# Patient Record
Sex: Male | Born: 1950 | Race: White | Hispanic: No | Smoking: Never smoker
Health system: Southern US, Community
[De-identification: ages and names within clinical notes are randomized; demographics above are authoritative.]

## PROBLEM LIST (undated history)

## (undated) DIAGNOSIS — M1990 Unspecified osteoarthritis, unspecified site: Secondary | ICD-10-CM

## (undated) DIAGNOSIS — K219 Gastro-esophageal reflux disease without esophagitis: Secondary | ICD-10-CM

## (undated) DIAGNOSIS — C61 Malignant neoplasm of prostate: Secondary | ICD-10-CM

## (undated) DIAGNOSIS — R3915 Urgency of urination: Secondary | ICD-10-CM

## (undated) DIAGNOSIS — I10 Essential (primary) hypertension: Secondary | ICD-10-CM

## (undated) DIAGNOSIS — Z973 Presence of spectacles and contact lenses: Secondary | ICD-10-CM

## (undated) DIAGNOSIS — K5730 Diverticulosis of large intestine without perforation or abscess without bleeding: Secondary | ICD-10-CM

## (undated) DIAGNOSIS — R350 Frequency of micturition: Secondary | ICD-10-CM

## (undated) DIAGNOSIS — N400 Enlarged prostate without lower urinary tract symptoms: Secondary | ICD-10-CM

## (undated) DIAGNOSIS — K649 Unspecified hemorrhoids: Secondary | ICD-10-CM

## (undated) DIAGNOSIS — Z923 Personal history of irradiation: Secondary | ICD-10-CM

## (undated) DIAGNOSIS — Z87442 Personal history of urinary calculi: Secondary | ICD-10-CM

## (undated) DIAGNOSIS — N201 Calculus of ureter: Secondary | ICD-10-CM

## (undated) DIAGNOSIS — N200 Calculus of kidney: Secondary | ICD-10-CM

## (undated) DIAGNOSIS — Z8709 Personal history of other diseases of the respiratory system: Secondary | ICD-10-CM

## (undated) HISTORY — PX: EXTRACORPOREAL SHOCK WAVE LITHOTRIPSY: SHX1557

---

## 2006-12-24 ENCOUNTER — Ambulatory Visit (HOSPITAL_COMMUNITY): Admission: RE | Admit: 2006-12-24 | Discharge: 2006-12-24 | Payer: Self-pay | Admitting: Urology

## 2011-07-14 ENCOUNTER — Other Ambulatory Visit: Payer: Self-pay | Admitting: Urology

## 2011-07-15 ENCOUNTER — Encounter (HOSPITAL_COMMUNITY): Payer: Self-pay | Admitting: Pharmacy Technician

## 2011-07-16 ENCOUNTER — Encounter (HOSPITAL_COMMUNITY): Payer: Self-pay | Admitting: *Deleted

## 2011-07-16 NOTE — Pre-Procedure Instructions (Signed)
Patient instructed to bring blue folder from [facility] with forms filed out, to be npo after midnight Sunday then may have clear liquids until 1100 am,. Patient informed not to take aspirin, ibuprofen or toradol 72 hours prior to the litho, also to stop all vitamins and supplements prior til litho. Patient to arrive in short stay with driver, insurance card, and picture ID at 1500 on Jul 21, 2011.

## 2011-07-21 ENCOUNTER — Ambulatory Visit (HOSPITAL_COMMUNITY)
Admission: RE | Admit: 2011-07-21 | Discharge: 2011-07-21 | Disposition: A | Discharge status: Home / Self Care | Payer: BC Managed Care – PPO | Source: Ambulatory Visit | Attending: Urology | Admitting: Urology

## 2011-07-21 ENCOUNTER — Ambulatory Visit (HOSPITAL_COMMUNITY): Payer: BC Managed Care – PPO

## 2011-07-21 ENCOUNTER — Encounter (HOSPITAL_COMMUNITY)
Admission: RE | Disposition: A | Discharge status: Home / Self Care | Payer: Self-pay | Source: Ambulatory Visit | Attending: Urology

## 2011-07-21 DIAGNOSIS — Z01818 Encounter for other preprocedural examination: Secondary | ICD-10-CM | POA: Insufficient documentation

## 2011-07-21 DIAGNOSIS — I10 Essential (primary) hypertension: Secondary | ICD-10-CM | POA: Insufficient documentation

## 2011-07-21 DIAGNOSIS — N200 Calculus of kidney: Secondary | ICD-10-CM | POA: Diagnosis present

## 2011-07-21 DIAGNOSIS — N201 Calculus of ureter: Secondary | ICD-10-CM | POA: Insufficient documentation

## 2011-07-21 DIAGNOSIS — Z79899 Other long term (current) drug therapy: Secondary | ICD-10-CM | POA: Insufficient documentation

## 2011-07-21 HISTORY — DX: Essential (primary) hypertension: I10

## 2011-07-21 SURGERY — LITHOTRIPSY, ESWL
Anesthesia: LOCAL | Laterality: Left

## 2011-07-21 MED ORDER — DEXTROSE-NACL 5-0.45 % IV SOLN
Freq: Once | INTRAVENOUS | Status: AC
  Administered 2011-07-21: 16:00:00 via INTRAVENOUS

## 2011-07-21 MED ORDER — CIPROFLOXACIN HCL 500 MG PO TABS
ORAL_TABLET | ORAL | Status: AC
  Administered 2011-07-21: 500 mg via ORAL
  Filled 2011-07-21: qty 1

## 2011-07-21 MED ORDER — DIPHENHYDRAMINE HCL 25 MG PO CAPS
25.0000 mg | ORAL_CAPSULE | ORAL | Status: AC
  Administered 2011-07-21: 25 mg via ORAL

## 2011-07-21 MED ORDER — CIPROFLOXACIN HCL 500 MG PO TABS
500.0000 mg | ORAL_TABLET | Freq: Once | ORAL | Status: AC
  Administered 2011-07-21: 500 mg via ORAL

## 2011-07-21 MED ORDER — DIAZEPAM 5 MG PO TABS
10.0000 mg | ORAL_TABLET | ORAL | Status: AC
  Administered 2011-07-21: 10 mg via ORAL

## 2011-07-21 MED ORDER — DIPHENHYDRAMINE HCL 25 MG PO CAPS
ORAL_CAPSULE | ORAL | Status: AC
  Administered 2011-07-21: 25 mg via ORAL
  Filled 2011-07-21: qty 1

## 2011-07-21 MED ORDER — DIAZEPAM 5 MG PO TABS
ORAL_TABLET | ORAL | Status: AC
  Administered 2011-07-21: 10 mg via ORAL
  Filled 2011-07-21: qty 2

## 2011-07-21 NOTE — Interval H&P Note (Signed)
History and Physical Interval Note:   07/21/2011   5:23 PM   Jeffrey Goodman  has presented today for surgery, with the diagnosis of left ureteral stone  The various methods of treatment have been discussed with the patient and family. After consideration of risks, benefits and other options for treatment, the patient has consented to  Procedure(s): EXTRACORPOREAL SHOCK WAVE LITHOTRIPSY (ESWL) as a surgical intervention .  The patients' history has been reviewed, patient examined, no change in status, stable for surgery.  I have reviewed the patients' chart and labs.  Questions were answered to the patient's satisfaction.     Anner Crete  MD

## 2011-07-21 NOTE — Progress Notes (Signed)
Pt. Came back from lithotripsy truck at 18:18pm via wheelchair. Pt. Was A&O x 3. Left flank was pinkish in color. The skin was not broken or oozing. Pt. Denied pain. IV patent. Vital signs stable. Lenn Sink, Charity fundraiser.

## 2011-07-21 NOTE — Brief Op Note (Signed)
07/21/2011  5:29 PM  PATIENT:  Jeffrey Goodman  60 y.o. male  PRE-OPERATIVE DIAGNOSIS:  left renalstone  POST-OPERATIVE DIAGNOSIS:  Left renal stone  PROCEDURE:  Procedure(s): EXTRACORPOREAL SHOCK WAVE LITHOTRIPSY (ESWL)  SURGEON:  Surgeon(s): Anner Crete  PHYSICIAN ASSISTANT:   ASSISTANTS: none   ANESTHESIA:   IV sedation  EBL:     BLOOD ADMINISTERED:none  DRAINS: none   LOCAL MEDICATIONS USED:  NONE  SPECIMEN:  No Specimen  DISPOSITION OF SPECIMEN:  N/A  COUNTS:  YES  TOURNIQUET:  * No tourniquets in log *  DICTATION: .Note written in paper chart  PLAN OF CARE: Discharge to home after PACU  PATIENT DISPOSITION:  PACU - hemodynamically stable.   Delay start of Pharmacological VTE agent (>24hrs) due to surgical blood loss or risk of bleeding:  {YES/NO/NOT APPLICABLE:20182

## 2011-07-21 NOTE — H&P (Signed)
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 Ureteral Stone Left 592.1  History of Present Illness  Jeffrey Goodman is a former patient of Dr. Aldean Ast with a history of stones. He is to have left ESWL today.  He had the onset last Tuesday of left flank pain.  The pain was initially moderate but became severe over the weekend.  He had seen his PCP and got hydrocodone and tamsulosin on Friday.  He has had no nausea.  He had a UA that showed trace blood.  He has no gross hematuria or urinary symptoms.  He has had two prior calcium oxalate stones.  He had ESWL in 4/08 on the right.   Past Medical History Problems  1. History of  Arthritis V13.4 2. History of  Hypertension 401.9 3. History of  Nephrolithiasis Of The Left Kidney V13.01 4. History of  Nephrolithiasis Of The Right Kidney V13.01  Surgical History Problems  1. History of  Lithotripsy  Current Meds 1. Benadryl TABS; Therapy: (Recorded:29Apr2008) to 2. Diltia XT 240 MG CP24; Therapy: (Recorded:10Apr2008) to 3. GuanFACINE HCl 2 MG Oral Tablet; Therapy: 10Jan2012 to 4. Hydrochlorothiazide 25 MG Oral Tablet; Therapy: (Recorded:10Apr2008) to 5. Hydrocodone-Acetaminophen 5-325 MG Oral Tablet; Therapy: 02Nov2012 to 6. Multi-Vitamin TABS; Therapy: (Recorded:29Apr2008) to 7. Tamsulosin HCl 0.4 MG Oral Capsule; Therapy: 02Nov2012 to  Allergies Medication  1. No Known Drug Allergies  Family History Problems  1. Paternal history of  Diabetes Mellitus V18.0 2. Family history of  Family Health Status Number Of Children 2 sons  +   1 son deceased 3. Paternal history of  Hypertension V17.49 4. Maternal history of  Hypertension V17.49 5. Family history of  Nephrolithiasis 6. 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 Denied    History of   Tobacco Use V15.82 occasional shewing tobacco  Review of Systems Genitourinary, constitutional, skin, eye, otolaryngeal, hematologic/lymphatic, cardiovascular, pulmonary, endocrine, musculoskeletal, gastrointestinal, neurological and psychiatric system(s) were reviewed and pertinent findings if present are noted.  Genitourinary: nocturia.  Gastrointestinal: abdominal pain and heartburn.  ENT: sinus problems.  Musculoskeletal: back pain and joint pain.    Vitals Vital Signs [Data Includes: Last 1 Day]  05Nov2012 10:39AM  BMI Calculated: 32.24 BSA Calculated: 2.29 Height: 6 ft  Weight: 238 lb  Blood Pressure: 160 / 108 Temperature: 99.1 F Heart Rate: 88  Physical Exam Constitutional: Well nourished and well developed . No acute distress.  ENT:. The ears and nose are normal in appearance.  Neck: The appearance of the neck is normal and no neck mass is present.  Pulmonary: No respiratory distress and normal respiratory rhythm and effort.  Cardiovascular: Heart rate and rhythm are normal . No peripheral edema.  Abdomen: The abdomen is soft and nontender. No masses are palpated. No CVA tenderness. No hernias are palpable. No hepatosplenomegaly noted.  Skin: Normal skin turgor, no visible rash and no visible skin lesions.  Neuro/Psych:. Mood and affect are appropriate.    Results/Data Urine [Data Includes: Last 1 Day]  05Nov2012  COLOR: YELLOW  Reference Range YELLOW APPEARANCE: CLEAR  Reference Range CLEAR SPECIFIC GRAVITY: 1.025  Reference Range 1.005-1.030 pH: 6.0  Reference Range 5.0-8.0 GLUCOSE: NEG mg/dL Reference Range NEG BILIRUBIN: NEG  Reference Range NEG KETONE: TRACE mg/dL Abnormal Reference Range NEG BLOOD: TRACE  Abnormal Reference Range NEG PROTEIN: NEG mg/dL Reference Range NEG UROBILINOGEN: 0.2 mg/dL Reference Range 1.6-1.0 NITRITE: NEG  Reference Range NEG LEUKOCYTE ESTERASE: NEG  Reference Range NEG SQUAMOUS EPITHELIAL/HPF: NONE SEEN  Reference Range  RARE WBC: 4-6 WBC/hpf Abnormal Reference Range <4 RBC: 0-3 RBC/hpf Reference Range <4 BACTERIA: NONE SEEN  Reference Range RARE CRYSTALS: NONE SEEN  Reference Range NEG CASTS: Hyaline casts noted  Reference Range NEG  Old records or history reviewed:Marland Kitchen  The following images/tracing/specimen were independently visualized:  KUB today shows a 17x20mm LUPJ stone and smaller RLP [employer] stones. No other significant abnormalities are noted.    Assessment Assessed  1. Ureteral Stone Left 592.1   He has a 17mm LUPJ stone with symptoms.   Plan Health Maintenance (V70.0)  1. UA With REFLEX  Done: 05Nov2012 10:18AM Ureteral Stone (592.1)  2. KUB  Done: 05Nov2012 12:00AM 3. PTH INTACT with CALCIUM  Requested for: 05Nov2012 4. Follow-up Schedule Surgery Office  Follow-up  Requested for: 05Nov2012   He needs ESWL and the risks including but not limited to bleeding, infection, renal and organ injuries, need for secondary procedures, thrombotic events and sedation risks.  He has a 90% chance of fragmentation and about a 75% chance of not needing additional procedures.   Discussion/Summary  CC: Dr Marjory Lies.

## 2011-07-22 ENCOUNTER — Encounter (HOSPITAL_COMMUNITY): Payer: Self-pay

## 2012-10-11 ENCOUNTER — Other Ambulatory Visit: Payer: Self-pay | Admitting: Orthopedic Surgery

## 2012-10-11 MED ORDER — DEXAMETHASONE SODIUM PHOSPHATE 10 MG/ML IJ SOLN: 10.0000 mg | Freq: Once | INTRAMUSCULAR | Status: DC

## 2012-10-11 MED ORDER — BUPIVACAINE LIPOSOME 1.3 % IJ SUSP: 20.0000 mL | Freq: Once | INTRAMUSCULAR | Status: DC

## 2012-10-11 NOTE — Progress Notes (Signed)
Preoperative surgical orders have been place into the Epic hospital system for Jeffrey Goodman on 10/11/2012, 7:41 AM  by Patrica Duel for surgery on 11/08/2012.  Preop Total Knee orders including Experal, IV Tylenol, and IV Decadron as long as there are no contraindications to the above medications. Avel Peace, PA-C

## 2012-10-29 ENCOUNTER — Encounter (HOSPITAL_COMMUNITY): Payer: Self-pay | Admitting: Pharmacy Technician

## 2012-11-02 ENCOUNTER — Ambulatory Visit (HOSPITAL_COMMUNITY)
Admission: RE | Admit: 2012-11-02 | Discharge: 2012-11-02 | Disposition: A | Discharge status: Home / Self Care | Payer: BC Managed Care – PPO | Source: Ambulatory Visit | Attending: Orthopedic Surgery | Admitting: Orthopedic Surgery

## 2012-11-02 ENCOUNTER — Encounter (HOSPITAL_COMMUNITY)
Admission: RE | Admit: 2012-11-02 | Discharge: 2012-11-02 | Disposition: A | Discharge status: Home / Self Care | Payer: BC Managed Care – PPO | Source: Ambulatory Visit | Attending: Orthopedic Surgery | Admitting: Orthopedic Surgery

## 2012-11-02 ENCOUNTER — Other Ambulatory Visit: Payer: Self-pay | Admitting: Orthopedic Surgery

## 2012-11-02 ENCOUNTER — Encounter (HOSPITAL_COMMUNITY): Payer: Self-pay

## 2012-11-02 DIAGNOSIS — I10 Essential (primary) hypertension: Secondary | ICD-10-CM | POA: Insufficient documentation

## 2012-11-02 DIAGNOSIS — Z01812 Encounter for preprocedural laboratory examination: Secondary | ICD-10-CM | POA: Insufficient documentation

## 2012-11-02 DIAGNOSIS — Z01818 Encounter for other preprocedural examination: Secondary | ICD-10-CM | POA: Insufficient documentation

## 2012-11-02 HISTORY — DX: Gastro-esophageal reflux disease without esophagitis: K21.9

## 2012-11-02 LAB — URINALYSIS, ROUTINE W REFLEX MICROSCOPIC
Nitrite: NEGATIVE
Protein, ur: NEGATIVE mg/dL
Urobilinogen, UA: 1 mg/dL (ref 0.0–1.0)

## 2012-11-02 LAB — CBC
HCT: 41.9 % (ref 39.0–52.0)
MCHC: 35.3 g/dL (ref 30.0–36.0)
MCV: 92.5 fL (ref 78.0–100.0)
Platelets: 225 10*3/uL (ref 150–400)
RDW: 12.5 % (ref 11.5–15.5)

## 2012-11-02 LAB — PROTIME-INR
INR: 1.07 (ref 0.00–1.49)
Prothrombin Time: 13.8 seconds (ref 11.6–15.2)

## 2012-11-02 LAB — COMPREHENSIVE METABOLIC PANEL
AST: 43 U/L — ABNORMAL HIGH (ref 0–37)
Albumin: 4 g/dL (ref 3.5–5.2)
BUN: 24 mg/dL — ABNORMAL HIGH (ref 6–23)
Creatinine, Ser: 1.11 mg/dL (ref 0.50–1.35)
Total Protein: 6.9 g/dL (ref 6.0–8.3)

## 2012-11-02 LAB — SURGICAL PCR SCREEN
MRSA, PCR: NEGATIVE
Staphylococcus aureus: POSITIVE — AB

## 2012-11-02 LAB — APTT: aPTT: 35 seconds (ref 24–37)

## 2012-11-02 NOTE — Patient Instructions (Signed)
YOUR SURGERY IS SCHEDULED AT [facility]  ON:  Monday  3/3  REPORT TO [place] [facility] AT:  6:00 AM      PHONE # FOR SHORT STAY IS [phone]  DO NOT EAT OR DRINK ANYTHING AFTER MIDNIGHT THE NIGHT BEFORE YOUR SURGERY.  YOU MAY BRUSH YOUR TEETH, RINSE OUT YOUR MOUTH--BUT NO WATER, NO FOOD, NO CHEWING GUM, NO MINTS, NO CANDIES, NO CHEWING TOBACCO.  PLEASE TAKE THE FOLLOWING MEDICATIONS THE AM OF YOUR SURGERY WITH A FEW SIPS OF WATER:  DILTIAZEM AND PRILOSEC.  MAY USE AFRIN NASAL SPRAY-AND BRING SPRAY TO HOSPITAL.   DO NOT BRING VALUABLES, MONEY, CREDIT CARDS.  DO NOT WEAR JEWELRY, MAKE-UP, NAIL POLISH AND NO METAL PINS OR CLIPS IN YOUR HAIR. CONTACT LENS, DENTURES / PARTIALS, GLASSES SHOULD NOT BE WORN TO SURGERY AND IN MOST CASES-HEARING AIDS WILL NEED TO BE REMOVED.  BRING YOUR GLASSES CASE, ANY EQUIPMENT NEEDED FOR YOUR CONTACT LENS. FOR PATIENTS ADMITTED TO THE HOSPITAL--CHECK OUT TIME THE DAY OF DISCHARGE IS 11:00 AM.  ALL INPATIENT ROOMS ARE PRIVATE - WITH BATHROOM, TELEPHONE, TELEVISION AND WIFI INTERNET.                             PLEASE READ OVER ANY  FACT SHEETS THAT YOU WERE GIVEN: MRSA INFORMATION, BLOOD TRANSFUSION INFORMATION, INCENTIVE SPIROMETER INFORMATION. FAILURE TO FOLLOW THESE INSTRUCTIONS MAY RESULT IN THE CANCELLATION OF YOUR SURGERY.   PATIENT SIGNATURE_________________________________

## 2012-11-02 NOTE — Pre-Procedure Instructions (Signed)
PREOP CBC, CMET, PT, PTT, UA, T/S, EKG AND CXR WERE DONE AS PER ORDERS DR. Lequita Halt AND ANESTHESIOLOGIST'S GUIDELINES.

## 2012-11-02 NOTE — H&P (Signed)
Jeffrey Goodman  DOB: 1950/11/21 Single / Language: [place] / Race: White Male  Date of Admission:  11/08/2012  Chief Complaint:  Left Knee Pain  History of Present Illness The patient is a 62 year old male who comes in for a preoperative History and Physical. The patient is scheduled for a left total knee arthroplasty to be performed by Dr. Gus Rankin. Aluisio, MD at [facility] . Please see the hospital record for complete dictated history and physical. The patient is a 62 year old male who presents with knee complaints. The patient was seen for a second opinion. The patient reports left knee (worse than right) symptoms including: pain, swelling, stiffness and soreness . The patient describes the severity of the symptoms as moderate in severity.The patient feels that the symptoms are worsening. The patient has the current diagnosis of knee osteoarthritis. Prior to being seen today the patient was previously evaluated by a colleague (He has been recently seeing Dr. Madelon Lips at [facility]. He previously was seen here and completed a series of Synvisc in the left knee in 2009.). Previous work-up for this problem has included knee x-rays. Past treatment for this problem has included intra-articular injection of corticosteroids (as well as 5 series of visco in the left knee, and 2 or 3 series in the right. The visco injections have become less effective each time). Symptoms are reported to be located in the left knee (worse than right) and include lateral knee pain, decreased range of motion and difficulty bearing weight. Symptoms are relieved by rest and ice. Current treatment includes application of ice, nonsteroidal anti-inflammatory drugs (Aleve) and non-opioid analgesics (acetaminophen). Note for "Knee pain": He states he has been told he needs to have a total knee. He comes in back today, because his mother had a knee replacement here and she has been doing very well. Nida Boatman has had  several series of visco supplements with Dr. Madelon Lips. Unfortunately, each series has subsequently had less effect. I operated on his mother about ten years ago doing an arthroplasty procedure. He wanted to come to me to be evaluated and possibly have his knee fixed. The knee is hurting him at all times. This is limiting what he can and can not do. He still continues to work but it is getting more difficult to do his occupation responsibilities. He does have pain at night. He does have functional limitations. He is not having hip pain, back pain or lower extremity weakness or paresthesias. He is not having any right knee pain at this time. He is ready to get the left knee fixed. They have been treated conservatively in the past for the above stated problem and despite conservative measures, they continue to have progressive pain and severe functional limitations and dysfunction. They have failed non-operative management including home exercise, medications, and injections. It is felt that they would benefit from undergoing total joint replacement. Risks and benefits of the procedure have been discussed with the patient and they elect to proceed with surgery. There are no active contraindications to surgery such as ongoing infection or rapidly progressive neurological disease.    Problem List Primary osteoarthritis of both knees (715.16)   Allergies No Known Drug Allergies   Family History Cerebrovascular Accident. mother Diabetes Mellitus. father Heart Disease. father Bleeding disorder. father Cancer. grandmother mothers side Hypertension. mother, father and brother Osteoarthritis. mother and father Osteoporosis. mother Father. Deceased, Dementia. age 19   Social History Drug/Alcohol Rehab (Previously). no Exercise. Exercises weekly; does  gym / weights Illicit drug use. no Children. 2 Current work status. working full time Drug/Alcohol Rehab (Currently). no Tobacco  / smoke exposure. yes outdoors only Tobacco use. never smoker Marital status. divorced Number of flights of stairs before winded. less than 1 Pain Contract. no Alcohol use. current drinker; drinks wine; 5-7 per week Post-Surgical Plans. Plan is to go home. Current occupation. Senior Equities trader   Medication History Diltiazem HCl ER Beads (240MG  Capsule ER 24HR, Oral two times daily) Active. GuanFACINE HCl (2MG  Tablet, Oral) Active. Hydrochlorothiazide (25MG  Tablet, Oral) Active.   Past Surgical History Lithotripsy. Twice   Medical History High blood pressure Kidney Stone Hemorrhoids. External; Occassional flare   Review of Systems General:Not Present- Chills, Fever, Night Sweats, Fatigue, Weight Gain, Weight Loss and Memory Loss. Skin:Not Present- Hives, Itching, Rash, Eczema and Lesions. HEENT:Not Present- Tinnitus, Headache, Double Vision, Visual Loss, Hearing Loss and Dentures. Respiratory:Not Present- Shortness of breath with exertion, Shortness of breath at rest, Allergies, Coughing up blood and Chronic Cough. Cardiovascular:Not Present- Chest Pain, Racing/skipping heartbeats, Difficulty Breathing Lying Down, Murmur, Swelling and Palpitations. Gastrointestinal:Not Present- Bloody Stool, Heartburn, Abdominal Pain, Vomiting, Nausea, Constipation, Diarrhea, Difficulty Swallowing, Jaundice and Loss of appetitie. Male Genitourinary:Not Present- Urinary frequency, Blood in Urine, Weak urinary stream, Discharge, Flank Pain, Incontinence, Painful Urination, Urgency, Urinary Retention and Urinating at Night. Musculoskeletal:Present- Joint Swelling and Joint Pain. Not Present- Muscle Weakness, Muscle Pain, Back Pain, Morning Stiffness and Spasms. Neurological:Not Present- Tremor, Dizziness, Blackout spells, Paralysis, Difficulty with balance and Weakness. Psychiatric:Not Present- Insomnia.   Vitals Weight: 230 lb Height: 71 in Weight was  reported by patient. Height was reported by patient. Body Surface Area: 2.29 m Body Mass Index: 32.08 kg/m Pulse: 76 (Regular) Resp.: 14 (Unlabored) BP: 136/84 (Sitting, Right Arm, Standard)    Physical Exam The physical exam findings are as follows:  Note: Patient is a 62 year old male with continued knee pain.   General Mental Status - Alert, cooperative and good historian. General Appearance- pleasant. Not in acute distress. Orientation- Oriented X3. Build & Nutrition- Well nourished and Well developed.   Head and Neck Head- normocephalic, atraumatic . Neck Global Assessment- supple. no bruit auscultated on the right and no bruit auscultated on the left.   Eye Vision- Wears corrective lenses. Pupil- Bilateral- Regular and Round. Motion- Bilateral- EOMI.   Chest and Lung Exam Auscultation: Breath sounds:- clear at anterior chest wall and - clear at posterior chest wall. Adventitious sounds:- No Adventitious sounds.   Cardiovascular Auscultation:Rhythm- Regular rate and rhythm. Heart Sounds- S1 WNL and S2 WNL. Murmurs & Other Heart Sounds:Auscultation of the heart reveals - No Murmurs.   Abdomen Palpation/Percussion:Tenderness- Abdomen is non-tender to palpation. Rigidity (guarding)- Abdomen is soft. Auscultation:Auscultation of the abdomen reveals - Bowel sounds normal.   Male Genitourinary Not done, not pertinent to present illness  Musculoskeletal He is a well developed male. He is alert and oriented. No apparent distress. Both hips show a normal range of motion. No discomfort. The right knee shows no effusion. Range is 5-135. There is no medial or lateral joint line tenderness or instability noted. The left knee shows no effusion. Varus deformity. Range is 5-140. There is marked crepitus on range of motion. There is tenderness medial greater than lateral with no instability noted. Pulse, sensation and motor are intact.  Gait pattern is non-antalgic.  RADIOGRAPHS: AP of both knees and lateral show that he has bone on bone arthritis in the medial and patellofemoral compartments of the left  knee with varus deformity.  Assessment & Plan Primary osteoarthritis of both knees (715.16)  Note: Plan is for a Left Total Knee Replacement by Dr. Lequita Halt.  Plan is to go home.  PCP - Dr. Fuller Mandril [facility] - Dr. Annabell Howells  Signed electronically by Roberts Gaudy, PA-C

## 2012-11-07 NOTE — Anesthesia Preprocedure Evaluation (Addendum)
Anesthesia Evaluation  Patient identified by MRN, date of birth, ID band Patient awake    Reviewed: Allergy & Precautions, H&P , NPO status , Patient's Chart, lab work & pertinent test results, reviewed documented beta blocker date and time   Airway Mallampati: II TM Distance: >3 FB Neck ROM: full    Dental no notable dental hx. (+) Teeth Intact and Dental Advisory Given   Pulmonary neg pulmonary ROS,  breath sounds clear to auscultation  Pulmonary exam normal       Cardiovascular hypertension, Pt. on medications Rhythm:regular Rate:Normal     Neuro/Psych negative neurological ROS  negative psych ROS   GI/Hepatic negative GI ROS, Neg liver ROS, GERD-  Medicated and Controlled,  Endo/Other  negative endocrine ROS  Renal/GU negative Renal ROS  negative genitourinary   Musculoskeletal   Abdominal   Peds  Hematology negative hematology ROS (+)   Anesthesia Other Findings   Reproductive/Obstetrics negative OB ROS                          Anesthesia Physical Anesthesia Plan  ASA: II  Anesthesia Plan: Spinal   Post-op Pain Management:    Induction:   Airway Management Planned: Simple Face Mask  Additional Equipment:   Intra-op Plan:   Post-operative Plan:   Informed Consent: I have reviewed the patients History and Physical, chart, labs and discussed the procedure including the risks, benefits and alternatives for the proposed anesthesia with the patient or authorized representative who has indicated his/her understanding and acceptance.   Dental Advisory Given  Plan Discussed with: CRNA and Surgeon  Anesthesia Plan Comments:         Anesthesia Quick Evaluation

## 2012-11-08 ENCOUNTER — Encounter (HOSPITAL_COMMUNITY): Payer: Self-pay | Admitting: *Deleted

## 2012-11-08 ENCOUNTER — Inpatient Hospital Stay (HOSPITAL_COMMUNITY)
Admission: RE | Admit: 2012-11-08 | Discharge: 2012-11-10 | DRG: 209 | Disposition: A | Discharge status: Home Health | Payer: BC Managed Care – PPO | Source: Ambulatory Visit | Attending: Orthopedic Surgery | Admitting: Orthopedic Surgery

## 2012-11-08 ENCOUNTER — Inpatient Hospital Stay (HOSPITAL_COMMUNITY): Payer: BC Managed Care – PPO | Admitting: Anesthesiology

## 2012-11-08 ENCOUNTER — Encounter (HOSPITAL_COMMUNITY): Payer: Self-pay | Admitting: Anesthesiology

## 2012-11-08 ENCOUNTER — Encounter (HOSPITAL_COMMUNITY)
Admission: RE | Disposition: A | Discharge status: Home Health | Payer: Self-pay | Source: Ambulatory Visit | Attending: Orthopedic Surgery

## 2012-11-08 DIAGNOSIS — Z87442 Personal history of urinary calculi: Secondary | ICD-10-CM

## 2012-11-08 DIAGNOSIS — M179 Osteoarthritis of knee, unspecified: Secondary | ICD-10-CM | POA: Diagnosis present

## 2012-11-08 DIAGNOSIS — Z96652 Presence of left artificial knee joint: Secondary | ICD-10-CM

## 2012-11-08 DIAGNOSIS — J329 Chronic sinusitis, unspecified: Secondary | ICD-10-CM | POA: Diagnosis present

## 2012-11-08 DIAGNOSIS — M1710 Unilateral primary osteoarthritis, unspecified knee: Principal | ICD-10-CM | POA: Diagnosis present

## 2012-11-08 DIAGNOSIS — I10 Essential (primary) hypertension: Secondary | ICD-10-CM | POA: Diagnosis present

## 2012-11-08 DIAGNOSIS — K219 Gastro-esophageal reflux disease without esophagitis: Secondary | ICD-10-CM | POA: Diagnosis present

## 2012-11-08 DIAGNOSIS — Z79899 Other long term (current) drug therapy: Secondary | ICD-10-CM

## 2012-11-08 DIAGNOSIS — Z8719 Personal history of other diseases of the digestive system: Secondary | ICD-10-CM

## 2012-11-08 HISTORY — PX: TOTAL KNEE ARTHROPLASTY: SHX125

## 2012-11-08 LAB — TYPE AND SCREEN
ABO/RH(D): O POS
Antibody Screen: NEGATIVE

## 2012-11-08 SURGERY — ARTHROPLASTY, KNEE, TOTAL
Anesthesia: Spinal | Site: Knee | Laterality: Left | Wound class: Clean

## 2012-11-08 MED ORDER — PHENOL 1.4 % MT LIQD
1.0000 | OROMUCOSAL | Status: DC | PRN
  Filled 2012-11-08: qty 177

## 2012-11-08 MED ORDER — PROPOFOL 10 MG/ML IV EMUL
INTRAVENOUS | Status: DC | PRN
  Administered 2012-11-08: 120 ug/kg/min via INTRAVENOUS

## 2012-11-08 MED ORDER — METHOCARBAMOL 100 MG/ML IJ SOLN: 500.0000 mg | Freq: Four times a day (QID) | INTRAVENOUS | Status: DC | PRN

## 2012-11-08 MED ORDER — SODIUM CHLORIDE 0.9 % IV SOLN: INTRAVENOUS | Status: DC

## 2012-11-08 MED ORDER — DIPHENHYDRAMINE HCL 12.5 MG/5ML PO ELIX: 12.5000 mg | ORAL_SOLUTION | ORAL | Status: DC | PRN

## 2012-11-08 MED ORDER — MORPHINE SULFATE 2 MG/ML IJ SOLN
1.0000 mg | INTRAMUSCULAR | Status: DC | PRN
  Administered 2012-11-08 – 2012-11-09 (×5): 2 mg via INTRAVENOUS
  Filled 2012-11-08 (×5): qty 1

## 2012-11-08 MED ORDER — OXYCODONE HCL 5 MG PO TABS
5.0000 mg | ORAL_TABLET | ORAL | Status: DC | PRN
  Administered 2012-11-08 – 2012-11-10 (×9): 10 mg via ORAL
  Filled 2012-11-08 (×9): qty 2

## 2012-11-08 MED ORDER — BUPIVACAINE LIPOSOME 1.3 % IJ SUSP
20.0000 mL | Freq: Once | INTRAMUSCULAR | Status: DC
  Filled 2012-11-08: qty 20

## 2012-11-08 MED ORDER — MENTHOL 3 MG MT LOZG
1.0000 | LOZENGE | OROMUCOSAL | Status: DC | PRN
  Filled 2012-11-08: qty 9

## 2012-11-08 MED ORDER — LACTATED RINGERS IV SOLN
INTRAVENOUS | Status: DC | PRN
  Administered 2012-11-08 (×3): via INTRAVENOUS

## 2012-11-08 MED ORDER — PROPOFOL 10 MG/ML IV BOLUS
INTRAVENOUS | Status: DC | PRN
  Administered 2012-11-08: 30 mg via INTRAVENOUS

## 2012-11-08 MED ORDER — METHOCARBAMOL 500 MG PO TABS
500.0000 mg | ORAL_TABLET | Freq: Four times a day (QID) | ORAL | Status: DC | PRN
  Administered 2012-11-08 – 2012-11-10 (×7): 500 mg via ORAL
  Filled 2012-11-08 (×7): qty 1

## 2012-11-08 MED ORDER — CEFAZOLIN SODIUM-DEXTROSE 2-3 GM-% IV SOLR
2.0000 g | Freq: Four times a day (QID) | INTRAVENOUS | Status: AC
  Administered 2012-11-08 (×2): 2 g via INTRAVENOUS
  Filled 2012-11-08 (×2): qty 50

## 2012-11-08 MED ORDER — POLYETHYLENE GLYCOL 3350 17 G PO PACK: 17.0000 g | PACK | Freq: Every day | ORAL | Status: DC | PRN

## 2012-11-08 MED ORDER — HYDROMORPHONE HCL PF 1 MG/ML IJ SOLN
0.2500 mg | INTRAMUSCULAR | Status: DC | PRN
  Administered 2012-11-08 (×5): 0.5 mg via INTRAVENOUS

## 2012-11-08 MED ORDER — BUPIVACAINE LIPOSOME 1.3 % IJ SUSP
INTRAMUSCULAR | Status: DC | PRN
  Administered 2012-11-08: 70 mL

## 2012-11-08 MED ORDER — ONDANSETRON HCL 4 MG PO TABS: 4.0000 mg | ORAL_TABLET | Freq: Four times a day (QID) | ORAL | Status: DC | PRN

## 2012-11-08 MED ORDER — CEFAZOLIN SODIUM-DEXTROSE 2-3 GM-% IV SOLR
2.0000 g | INTRAVENOUS | Status: AC
  Administered 2012-11-08: 2 g via INTRAVENOUS

## 2012-11-08 MED ORDER — GUANFACINE HCL 2 MG PO TABS
2.0000 mg | ORAL_TABLET | Freq: Every evening | ORAL | Status: DC
  Administered 2012-11-08 – 2012-11-09 (×2): 2 mg via ORAL
  Filled 2012-11-08 (×3): qty 1

## 2012-11-08 MED ORDER — ACETAMINOPHEN 10 MG/ML IV SOLN
1000.0000 mg | Freq: Four times a day (QID) | INTRAVENOUS | Status: AC
  Administered 2012-11-08 – 2012-11-09 (×4): 1000 mg via INTRAVENOUS
  Filled 2012-11-08 (×7): qty 100

## 2012-11-08 MED ORDER — METOCLOPRAMIDE HCL 10 MG PO TABS: 5.0000 mg | ORAL_TABLET | Freq: Three times a day (TID) | ORAL | Status: DC | PRN

## 2012-11-08 MED ORDER — MIDAZOLAM HCL 5 MG/5ML IJ SOLN
INTRAMUSCULAR | Status: DC | PRN
  Administered 2012-11-08: 2 mg via INTRAVENOUS

## 2012-11-08 MED ORDER — PANTOPRAZOLE SODIUM 40 MG PO TBEC
40.0000 mg | DELAYED_RELEASE_TABLET | Freq: Every day | ORAL | Status: DC
  Filled 2012-11-08: qty 1

## 2012-11-08 MED ORDER — DILTIAZEM HCL ER COATED BEADS 240 MG PO CP24
480.0000 mg | ORAL_CAPSULE | Freq: Every day | ORAL | Status: DC
  Administered 2012-11-09 – 2012-11-10 (×2): 480 mg via ORAL
  Filled 2012-11-08 (×2): qty 2

## 2012-11-08 MED ORDER — BUPIVACAINE HCL (PF) 0.75 % IJ SOLN
INTRAMUSCULAR | Status: DC | PRN
  Administered 2012-11-08: 15 mg via INTRATHECAL

## 2012-11-08 MED ORDER — MEPERIDINE HCL 50 MG/ML IJ SOLN
12.5000 mg | INTRAMUSCULAR | Status: DC | PRN
  Administered 2012-11-08: 12.5 mg via INTRAVENOUS

## 2012-11-08 MED ORDER — FLEET ENEMA 7-19 GM/118ML RE ENEM: 1.0000 | ENEMA | Freq: Once | RECTAL | Status: AC | PRN

## 2012-11-08 MED ORDER — TRAMADOL HCL 50 MG PO TABS: 50.0000 mg | ORAL_TABLET | Freq: Four times a day (QID) | ORAL | Status: DC | PRN

## 2012-11-08 MED ORDER — DOCUSATE SODIUM 100 MG PO CAPS
100.0000 mg | ORAL_CAPSULE | Freq: Two times a day (BID) | ORAL | Status: DC
  Administered 2012-11-08 – 2012-11-10 (×4): 100 mg via ORAL

## 2012-11-08 MED ORDER — RIVAROXABAN 10 MG PO TABS
10.0000 mg | ORAL_TABLET | Freq: Every day | ORAL | Status: DC
  Administered 2012-11-09 – 2012-11-10 (×2): 10 mg via ORAL
  Filled 2012-11-08 (×3): qty 1

## 2012-11-08 MED ORDER — LACTATED RINGERS IV SOLN: INTRAVENOUS | Status: DC

## 2012-11-08 MED ORDER — HYDROMORPHONE HCL PF 1 MG/ML IJ SOLN
0.5000 mg | INTRAMUSCULAR | Status: DC | PRN
  Administered 2012-11-08: 0.5 mg via INTRAVENOUS

## 2012-11-08 MED ORDER — ACETAMINOPHEN 650 MG RE SUPP: 650.0000 mg | Freq: Four times a day (QID) | RECTAL | Status: DC | PRN

## 2012-11-08 MED ORDER — DIPHENHYDRAMINE HCL 25 MG PO CAPS
50.0000 mg | ORAL_CAPSULE | Freq: Four times a day (QID) | ORAL | Status: DC | PRN
  Administered 2012-11-09 (×2): 25 mg via ORAL
  Administered 2012-11-10: 50 mg via ORAL
  Filled 2012-11-08: qty 1
  Filled 2012-11-08 (×2): qty 2

## 2012-11-08 MED ORDER — CHLORHEXIDINE GLUCONATE 4 % EX LIQD
60.0000 mL | Freq: Once | CUTANEOUS | Status: DC
  Filled 2012-11-08: qty 60

## 2012-11-08 MED ORDER — HYDROCHLOROTHIAZIDE 25 MG PO TABS
25.0000 mg | ORAL_TABLET | Freq: Every day | ORAL | Status: DC
  Administered 2012-11-09 – 2012-11-10 (×2): 25 mg via ORAL
  Filled 2012-11-08 (×2): qty 1

## 2012-11-08 MED ORDER — ACETAMINOPHEN 10 MG/ML IV SOLN
1000.0000 mg | Freq: Once | INTRAVENOUS | Status: AC
  Administered 2012-11-08: 1000 mg via INTRAVENOUS

## 2012-11-08 MED ORDER — ACETAMINOPHEN 325 MG PO TABS
650.0000 mg | ORAL_TABLET | Freq: Four times a day (QID) | ORAL | Status: DC | PRN
  Administered 2012-11-09 – 2012-11-10 (×2): 650 mg via ORAL
  Filled 2012-11-08 (×2): qty 2

## 2012-11-08 MED ORDER — ONDANSETRON HCL 4 MG/2ML IJ SOLN: 4.0000 mg | Freq: Four times a day (QID) | INTRAMUSCULAR | Status: DC | PRN

## 2012-11-08 MED ORDER — DEXTROSE-NACL 5-0.9 % IV SOLN
INTRAVENOUS | Status: DC
  Administered 2012-11-08 – 2012-11-10 (×4): via INTRAVENOUS

## 2012-11-08 MED ORDER — DEXAMETHASONE SODIUM PHOSPHATE 10 MG/ML IJ SOLN: 10.0000 mg | Freq: Once | INTRAMUSCULAR | Status: AC

## 2012-11-08 MED ORDER — OXYMETAZOLINE HCL 0.05 % NA SOLN
1.0000 | Freq: Two times a day (BID) | NASAL | Status: DC
  Administered 2012-11-08: 1 via NASAL
  Filled 2012-11-08: qty 15

## 2012-11-08 MED ORDER — DEXAMETHASONE 6 MG PO TABS
10.0000 mg | ORAL_TABLET | Freq: Once | ORAL | Status: AC
  Administered 2012-11-09: 10 mg via ORAL
  Filled 2012-11-08: qty 1

## 2012-11-08 MED ORDER — FENTANYL CITRATE 0.05 MG/ML IJ SOLN
INTRAMUSCULAR | Status: DC | PRN
  Administered 2012-11-08: 100 ug via INTRAVENOUS

## 2012-11-08 MED ORDER — BISACODYL 10 MG RE SUPP: 10.0000 mg | Freq: Every day | RECTAL | Status: DC | PRN

## 2012-11-08 MED ORDER — METOCLOPRAMIDE HCL 5 MG/ML IJ SOLN: 5.0000 mg | Freq: Three times a day (TID) | INTRAMUSCULAR | Status: DC | PRN

## 2012-11-08 SURGICAL SUPPLY — 58 items
BAG SPEC THK2 15X12 ZIP CLS (MISCELLANEOUS) ×1
BAG ZIPLOCK 12X15 (MISCELLANEOUS) ×2 IMPLANT
BANDAGE ELASTIC 6 VELCRO ST LF (GAUZE/BANDAGES/DRESSINGS) ×2 IMPLANT
BANDAGE ESMARK 6X9 LF (GAUZE/BANDAGES/DRESSINGS) ×1 IMPLANT
BLADE SAG 18X100X1.27 (BLADE) ×2 IMPLANT
BLADE SAW SGTL 11.0X1.19X90.0M (BLADE) ×2 IMPLANT
BNDG CMPR 9X6 STRL LF SNTH (GAUZE/BANDAGES/DRESSINGS) ×1
BNDG ESMARK 6X9 LF (GAUZE/BANDAGES/DRESSINGS) ×2
BOWL SMART MIX CTS (DISPOSABLE) ×2 IMPLANT
CEMENT HV SMART SET (Cement) ×4 IMPLANT
CLOSURE STERI-STRIP 1/4X4 (GAUZE/BANDAGES/DRESSINGS) ×2 IMPLANT
CLOTH BEACON ORANGE TIMEOUT ST (SAFETY) ×2 IMPLANT
CUFF TOURN SGL QUICK 34 (TOURNIQUET CUFF) ×2
CUFF TRNQT CYL 34X4X40X1 (TOURNIQUET CUFF) ×1 IMPLANT
DRAPE EXTREMITY T 121X128X90 (DRAPE) ×2 IMPLANT
DRAPE POUCH INSTRU U-SHP 10X18 (DRAPES) ×2 IMPLANT
DRAPE U-SHAPE 47X51 STRL (DRAPES) ×2 IMPLANT
DRSG ADAPTIC 3X8 NADH LF (GAUZE/BANDAGES/DRESSINGS) ×2 IMPLANT
DRSG PAD ABDOMINAL 8X10 ST (GAUZE/BANDAGES/DRESSINGS) ×1 IMPLANT
DURAPREP 26ML APPLICATOR (WOUND CARE) ×2 IMPLANT
ELECT REM PT RETURN 9FT ADLT (ELECTROSURGICAL) ×2
ELECTRODE REM PT RTRN 9FT ADLT (ELECTROSURGICAL) ×1 IMPLANT
EVACUATOR 1/8 PVC DRAIN (DRAIN) ×2 IMPLANT
FACESHIELD LNG OPTICON STERILE (SAFETY) ×10 IMPLANT
GLOVE BIO SURGEON STRL SZ7.5 (GLOVE) ×2 IMPLANT
GLOVE BIO SURGEON STRL SZ8 (GLOVE) ×2 IMPLANT
GLOVE BIOGEL PI IND STRL 8 (GLOVE) ×2 IMPLANT
GLOVE BIOGEL PI INDICATOR 8 (GLOVE) ×2
GLOVE SURG SS PI 6.5 STRL IVOR (GLOVE) ×4 IMPLANT
GOWN STRL NON-REIN LRG LVL3 (GOWN DISPOSABLE) ×4 IMPLANT
GOWN STRL REIN XL XLG (GOWN DISPOSABLE) ×2 IMPLANT
HANDPIECE INTERPULSE COAX TIP (DISPOSABLE) ×2
IMMOBILIZER KNEE 20 (SOFTGOODS) ×2
IMMOBILIZER KNEE 20 THIGH 36 (SOFTGOODS) ×1 IMPLANT
KIT BASIN OR (CUSTOM PROCEDURE TRAY) ×2 IMPLANT
MANIFOLD NEPTUNE II (INSTRUMENTS) ×2 IMPLANT
NDL SAFETY ECLIPSE 18X1.5 (NEEDLE) ×1 IMPLANT
NEEDLE HYPO 18GX1.5 SHARP (NEEDLE) ×2
NS IRRIG 1000ML POUR BTL (IV SOLUTION) ×2 IMPLANT
PACK TOTAL JOINT (CUSTOM PROCEDURE TRAY) ×2 IMPLANT
PAD ABD 7.5X8 STRL (GAUZE/BANDAGES/DRESSINGS) ×2 IMPLANT
PADDING CAST ABS 6INX4YD NS (CAST SUPPLIES) ×1
PADDING CAST ABS COTTON 6X4 NS (CAST SUPPLIES) IMPLANT
PADDING CAST COTTON 6X4 STRL (CAST SUPPLIES) ×6 IMPLANT
POSITIONER SURGICAL ARM (MISCELLANEOUS) ×2 IMPLANT
SET HNDPC FAN SPRY TIP SCT (DISPOSABLE) ×1 IMPLANT
SPONGE GAUZE 4X4 12PLY (GAUZE/BANDAGES/DRESSINGS) ×2 IMPLANT
STRIP CLOSURE SKIN 1/2X4 (GAUZE/BANDAGES/DRESSINGS) ×4 IMPLANT
SUCTION FRAZIER 12FR DISP (SUCTIONS) ×2 IMPLANT
SUT MNCRL AB 4-0 PS2 18 (SUTURE) ×2 IMPLANT
SUT VIC AB 2-0 CT1 27 (SUTURE) ×6
SUT VIC AB 2-0 CT1 TAPERPNT 27 (SUTURE) ×3 IMPLANT
SUT VLOC 180 0 24IN GS25 (SUTURE) ×2 IMPLANT
SYR 50ML LL SCALE MARK (SYRINGE) ×2 IMPLANT
TOWEL OR 17X26 10 PK STRL BLUE (TOWEL DISPOSABLE) ×4 IMPLANT
TRAY FOLEY CATH 14FRSI W/METER (CATHETERS) ×2 IMPLANT
WATER STERILE IRR 1500ML POUR (IV SOLUTION) ×2 IMPLANT
WRAP KNEE MAXI GEL POST OP (GAUZE/BANDAGES/DRESSINGS) ×4 IMPLANT

## 2012-11-08 NOTE — Anesthesia Procedure Notes (Signed)
Spinal  Patient location during procedure: OR Start time: 11/08/2012 7:58 AM End time: 11/08/2012 8:03 AM Staffing Anesthesiologist: Ronelle Nigh L Performed by: anesthesiologist  Preanesthetic Checklist Completed: patient identified, site marked, surgical consent, pre-op evaluation, timeout performed, IV checked, risks and benefits discussed and monitors and equipment checked Spinal Block Patient position: sitting Prep: Betadine Patient monitoring: heart rate, continuous pulse ox and blood pressure Approach: midline Location: L3-4 Injection technique: single-shot Needle Needle type: Whitacre  Needle gauge: 25 G Needle length: 9 cm Assessment Sensory level: T6 Additional Notes Expiration date of kit checked and confirmed. Patient tolerated procedure well, without complications.

## 2012-11-08 NOTE — H&P (View-Only) (Signed)
Jeffrey Goodman  DOB: 06/18/1951 Single / Language: English / Race: White Male  Date of Admission:  11/08/2012  Chief Complaint:  Left Knee Pain  History of Present Illness The patient is a 62 year old male who comes in for a preoperative History and Physical. The patient is scheduled for a left total knee arthroplasty to be performed by Dr. Frank V. Aluisio, MD at [facility] . Please see the hospital record for complete dictated history and physical. The patient is a 62 year old male who presents with knee complaints. The patient was seen for a second opinion. The patient reports left knee (worse than right) symptoms including: pain, swelling, stiffness and soreness . The patient describes the severity of the symptoms as moderate in severity.The patient feels that the symptoms are worsening. The patient has the current diagnosis of knee osteoarthritis. Prior to being seen today the patient was previously evaluated by a colleague (He has been recently seeing Dr. Caffrey at [facility]. He previously was seen here and completed a series of Synvisc in the left knee in 2009.). Previous work-up for this problem has included knee x-rays. Past treatment for this problem has included intra-articular injection of corticosteroids (as well as 5 series of visco in the left knee, and 2 or 3 series in the right. The visco injections have become less effective each time). Symptoms are reported to be located in the left knee (worse than right) and include lateral knee pain, decreased range of motion and difficulty bearing weight. Symptoms are relieved by rest and ice. Current treatment includes application of ice, nonsteroidal anti-inflammatory drugs (Aleve) and non-opioid analgesics (acetaminophen). Note for "Knee pain": He states he has been told he needs to have a total knee. He comes in back today, because his mother had a knee replacement here and she has been doing very well. [name] has had  several series of visco supplements with Dr. Caffrey. Unfortunately, each series has subsequently had less effect. I operated on his mother about ten years ago doing an arthroplasty procedure. He wanted to come to me to be evaluated and possibly have his knee fixed. The knee is hurting him at all times. This is limiting what he can and can not do. He still continues to work but it is getting more difficult to do his occupation responsibilities. He does have pain at night. He does have functional limitations. He is not having hip pain, back pain or lower extremity weakness or paresthesias. He is not having any right knee pain at this time. He is ready to get the left knee fixed. They have been treated conservatively in the past for the above stated problem and despite conservative measures, they continue to have progressive pain and severe functional limitations and dysfunction. They have failed non-operative management including home exercise, medications, and injections. It is felt that they would benefit from undergoing total joint replacement. Risks and benefits of the procedure have been discussed with the patient and they elect to proceed with surgery. There are no active contraindications to surgery such as ongoing infection or rapidly progressive neurological disease.    Problem List Primary osteoarthritis of both knees (715.16)   Allergies No Known Drug Allergies   Family History Cerebrovascular Accident. mother Diabetes Mellitus. father Heart Disease. father Bleeding disorder. father Cancer. grandmother mothers side Hypertension. mother, father and brother Osteoarthritis. mother and father Osteoporosis. mother Father. Deceased, Dementia. age 87   Social History Drug/Alcohol Rehab (Previously). no Exercise. Exercises weekly; does   gym / weights Illicit drug use. no Children. 2 Current work status. working full time Drug/Alcohol Rehab (Currently). no Tobacco  / smoke exposure. yes outdoors only Tobacco use. never smoker Marital status. divorced Number of flights of stairs before winded. less than 1 Pain Contract. no Alcohol use. current drinker; drinks wine; 5-7 per week Post-Surgical Plans. Plan is to go home. Current occupation. Senior Engineerin Technician   Medication History Diltiazem HCl ER Beads (240MG Capsule ER 24HR, Oral two times daily) Active. GuanFACINE HCl (2MG Tablet, Oral) Active. Hydrochlorothiazide (25MG Tablet, Oral) Active.   Past Surgical History Lithotripsy. Twice   Medical History High blood pressure Kidney Stone Hemorrhoids. External; Occassional flare   Review of Systems General:Not Present- Chills, Fever, Night Sweats, Fatigue, Weight Gain, Weight Loss and Memory Loss. Skin:Not Present- Hives, Itching, Rash, Eczema and Lesions. HEENT:Not Present- Tinnitus, Headache, Double Vision, Visual Loss, Hearing Loss and Dentures. Respiratory:Not Present- Shortness of breath with exertion, Shortness of breath at rest, Allergies, Coughing up blood and Chronic Cough. Cardiovascular:Not Present- Chest Pain, Racing/skipping heartbeats, Difficulty Breathing Lying Down, Murmur, Swelling and Palpitations. Gastrointestinal:Not Present- Bloody Stool, Heartburn, Abdominal Pain, Vomiting, Nausea, Constipation, Diarrhea, Difficulty Swallowing, Jaundice and Loss of appetitie. Male Genitourinary:Not Present- Urinary frequency, Blood in Urine, Weak urinary stream, Discharge, Flank Pain, Incontinence, Painful Urination, Urgency, Urinary Retention and Urinating at Night. Musculoskeletal:Present- Joint Swelling and Joint Pain. Not Present- Muscle Weakness, Muscle Pain, Back Pain, Morning Stiffness and Spasms. Neurological:Not Present- Tremor, Dizziness, Blackout spells, Paralysis, Difficulty with balance and Weakness. Psychiatric:Not Present- Insomnia.   Vitals Weight: 230 lb Height: 71 in Weight was  reported by patient. Height was reported by patient. Body Surface Area: 2.29 m Body Mass Index: 32.08 kg/m Pulse: 76 (Regular) Resp.: 14 (Unlabored) BP: 136/84 (Sitting, Right Arm, Standard)    Physical Exam The physical exam findings are as follows:  Note: Patient is a 61 year old male with continued knee pain.   General Mental Status - Alert, cooperative and good historian. General Appearance- pleasant. Not in acute distress. Orientation- Oriented X3. Build & Nutrition- Well nourished and Well developed.   Head and Neck Head- normocephalic, atraumatic . Neck Global Assessment- supple. no bruit auscultated on the right and no bruit auscultated on the left.   Eye Vision- Wears corrective lenses. Pupil- Bilateral- Regular and Round. Motion- Bilateral- EOMI.   Chest and Lung Exam Auscultation: Breath sounds:- clear at anterior chest wall and - clear at posterior chest wall. Adventitious sounds:- No Adventitious sounds.   Cardiovascular Auscultation:Rhythm- Regular rate and rhythm. Heart Sounds- S1 WNL and S2 WNL. Murmurs & Other Heart Sounds:Auscultation of the heart reveals - No Murmurs.   Abdomen Palpation/Percussion:Tenderness- Abdomen is non-tender to palpation. Rigidity (guarding)- Abdomen is soft. Auscultation:Auscultation of the abdomen reveals - Bowel sounds normal.   Male Genitourinary Not done, not pertinent to present illness  Musculoskeletal He is a well developed male. He is alert and oriented. No apparent distress. Both hips show a normal range of motion. No discomfort. The right knee shows no effusion. Range is 5-135. There is no medial or lateral joint line tenderness or instability noted. The left knee shows no effusion. Varus deformity. Range is 5-140. There is marked crepitus on range of motion. There is tenderness medial greater than lateral with no instability noted. Pulse, sensation and motor are intact.  Gait pattern is non-antalgic.  RADIOGRAPHS: AP of both knees and lateral show that he has bone on bone arthritis in the medial and patellofemoral compartments of the left   knee with varus deformity.  Assessment & Plan Primary osteoarthritis of both knees (715.16)  Note: Plan is for a Left Total Knee Replacement by Dr. Aluisio.  Plan is to go home.  PCP - Dr. Brent Burnette Urology - Dr. Wrenn  Signed electronically by DREW L PERKINS, PA-C 

## 2012-11-08 NOTE — Anesthesia Postprocedure Evaluation (Signed)
  Anesthesia Post-op Note  Patient: Jeffrey Goodman  Procedure(s) Performed: Procedure(s) (LRB): TOTAL KNEE ARTHROPLASTY (Left)  Patient Location: PACU  Anesthesia Type: Spinal  Level of Consciousness: awake and alert   Airway and Oxygen Therapy: Patient Spontanous Breathing  Post-op Pain: mild  Post-op Assessment: Post-op Vital signs reviewed, Patient's Cardiovascular Status Stable, Respiratory Function Stable, Patent Airway and No signs of Nausea or vomiting  Last Vitals:  Filed Vitals:   11/08/12 1015  BP:   Pulse:   Temp: 36.3 C  Resp:     Post-op Vital Signs: stable   Complications: No apparent anesthesia complications

## 2012-11-08 NOTE — Op Note (Signed)
Pre-operative diagnosis- Osteoarthritis  Left knee(s)  Post-operative diagnosis- Osteoarthritis Left knee(s)  Procedure-  Left  Total Knee Arthroplasty  Surgeon- Gus Rankin. Aluisio, MD  Assistant- Avel Peace, PA-C   Anesthesia-  Spinal EBL-* No blood loss amount entered *  Drains Hemovac  Tourniquet time-  Total Tourniquet Time Documented: Thigh (Left) - 37 minutes Total: Thigh (Left) - 37 minutes    Complications- None  Condition-PACU - hemodynamically stable.   Brief Clinical Note   Jeffrey Goodman is a 62 y.o. year old male with end stage OA of his left knee with progressively worsening pain and dysfunction. He has constant pain, with activity and at rest and significant functional deficits with difficulties even with ADLs. He has had extensive non-op management including analgesics, injections of cortisone and viscosupplements, and home exercise program, but remains in significant pain with significant dysfunction. Radiographs show bone on bone arthritis medial and patellofemoral with large varus deformity. He presents now for left Total Knee Arthroplasty.     Procedure in detail---   The patient is brought into the operating room and positioned supine on the operating table. After successful administration of  Spinal,   a tourniquet is placed high on the  Left thigh(s) and the lower extremity is prepped and draped in the usual sterile fashion. Time out is performed by the operating team and then the  Left lower extremity is wrapped in Esmarch, knee flexed and the tourniquet inflated to 300 mmHg.       A midline incision is made with a ten blade through the subcutaneous tissue to the level of the extensor mechanism. A fresh blade is used to make a medial parapatellar arthrotomy. Soft tissue over the proximal medial tibia is subperiosteally elevated to the joint line with a knife and into the semimembranosus bursa with a Cobb elevator. Soft tissue over the proximal lateral tibia is  elevated with attention being paid to avoiding the patellar tendon on the tibial tubercle. The patella is everted, knee flexed 90 degrees and the ACL and PCL are removed. Findings are bone on bone medial and patellofemoral with large medial osteophytes.        The drill is used to create a starting hole in the distal femur and the canal is thoroughly irrigated with sterile saline to remove the fatty contents. The 5 degree Left  valgus alignment guide is placed into the femoral canal and the distal femoral cutting block is pinned to remove 10 mm off the distal femur. Resection is made with an oscillating saw.      The tibia is subluxed forward and the menisci are removed. The extramedullary alignment guide is placed referencing proximally at the medial aspect of the tibial tubercle and distally along the second metatarsal axis and tibial crest. The block is pinned to remove 2mm off the more deficient medial  side. Resection is made with an oscillating saw. Size 4is the most appropriate size for the tibia and the proximal tibia is prepared with the modular drill and keel punch for that size.      The femoral sizing guide is placed and size 5 is most appropriate. Rotation is marked off the epicondylar axis and confirmed by creating a rectangular flexion gap at 90 degrees. The size 5 cutting block is pinned in this rotation and the anterior, posterior and chamfer cuts are made with the oscillating saw. The intercondylar block is then placed and that cut is made.      Trial size  4 tibial component, trial size 5 posterior stabilized femur and a 12.5  mm posterior stabilized rotating platform insert trial is placed. Full extension is achieved with excellent varus/valgus and anterior/posterior balance throughout full range of motion. The patella is everted and thickness measured to be 27  mm. Free hand resection is taken to 15 mm, a 41 template is placed, lug holes are drilled, trial patella is placed, and it tracks  normally. Osteophytes are removed off the posterior femur with the trial in place. All trials are removed and the cut bone surfaces prepared with pulsatile lavage. Cement is mixed and once ready for implantation, the size 4 tibial implant, size  5 posterior stabilized femoral component, and the size 41 patella are cemented in place and the patella is held with the clamp. The trial insert is placed and the knee held in full extension. The Exparel (20 ml mixed with 50 ml saline) is injected into the extensor mechanism, posterior capsule, medial and lateral gutters and subcutaneous tissues.  All extruded cement is removed and once the cement is hard the permanent 12.5 mm posterior stabilized rotating platform insert is placed into the tibial tray.      The wound is copiously irrigated with saline solution and the extensor mechanism closed over a hemovac drain with #1 PDS suture. The tourniquet is released for a total tourniquet time of 37  minutes. Flexion against gravity is 140 degrees and the patella tracks normally. Subcutaneous tissue is closed with 2.0 vicryl and subcuticular with running 4.0 Monocryl. The incision is cleaned and dried and steri-strips and a bulky sterile dressing are applied. The limb is placed into a knee immobilizer and the patient is awakened and transported to recovery in stable condition.      Please note that a surgical assistant was a medical necessity for this procedure in order to perform it in a safe and expeditious manner. Surgical assistant was necessary to retract the ligaments and vital neurovascular structures to prevent injury to them and also necessary for proper positioning of the limb to allow for anatomic placement of the prosthesis.   Gus Rankin Aluisio, MD    11/08/2012, 9:03 AM

## 2012-11-08 NOTE — Transfer of Care (Signed)
Immediate Anesthesia Transfer of Care Note  Patient: Jeffrey Goodman  Procedure(s) Performed: Procedure(s): TOTAL KNEE ARTHROPLASTY (Left)  Patient Location: PACU  Anesthesia Type:Spinal  Level of Consciousness: awake, alert , oriented and patient cooperative  Airway & Oxygen Therapy: Patient Spontanous Breathing and Patient connected to face mask oxygen  Post-op Assessment: Report given to PACU RN and Post -op Vital signs reviewed and stable  Post vital signs: stable  Complications: No apparent anesthesia complications  L4 spinal level

## 2012-11-08 NOTE — Evaluation (Signed)
Physical Therapy Evaluation Patient Details Name: Jeffrey Goodman MRN: [id number] DOB: Jun 15, 1951 Today's Date: 11/08/2012 Time: 1478-2956 PT Time Calculation (min): 27 min  PT Assessment / Plan / Recommendation Clinical Impression  Pt presents s/p L TKA POD 0 with decreased strength, ROM and mobility.  Tolerated OOB and ambulation into hallway well with RW, however did state some dizziness and was assisted back to chair.  BP normal.  Pt will benefit from skilled PT in acute venue to address deficits.  PT recommends HHPT for follow up at D/C to maximize pts safety and function.      PT Assessment  Patient needs continued PT services    Follow Up Recommendations  Home health PT    Does the patient have the potential to tolerate intense rehabilitation      Barriers to Discharge None      Equipment Recommendations  Rolling walker with 5" wheels    Recommendations for Other Services OT consult   Frequency 7X/week    Precautions / Restrictions Precautions Precautions: Knee Required Braces or Orthoses: Knee Immobilizer - Left Knee Immobilizer - Left: Discontinue once straight leg raise with < 10 degree lag Restrictions Weight Bearing Restrictions: No Other Position/Activity Restrictions: WBAT   Pertinent Vitals/Pain 5/10      Mobility  Bed Mobility Bed Mobility: Supine to Sit Supine to Sit: 4: Min assist;HOB elevated Details for Bed Mobility Assistance: Assist for LLE out of bed with cues for hand placement to self assist.   Transfers Transfers: Sit to Stand;Stand to Sit Sit to Stand: 1: +2 Total assist;From elevated surface;With upper extremity assist;From bed Sit to Stand: Patient Percentage: 70% Stand to Sit: 1: +2 Total assist;With upper extremity assist;With armrests;To chair/3-in-1 Stand to Sit: Patient Percentage: 70% Details for Transfer Assistance: cues for hand placement and LE management when sitting/standing.  Requires assist to steady when in standing and  some for LLE when sitting.  Ambulation/Gait Ambulation/Gait Assistance: 1: +2 Total assist Ambulation/Gait: Patient Percentage: 70% Ambulation Distance (Feet): 20 Feet Assistive device: Rolling walker Ambulation/Gait Assistance Details: Cues for sequencing/technique with RW, maintaining upright posture and continued breathing throughout.  Pt states some dizziness with amb and was assisted to chair.  Gait Pattern: Step-to pattern;Decreased stride length;Antalgic;Trunk flexed Gait velocity: decreased Stairs: No Wheelchair Mobility Wheelchair Mobility: No    Exercises     PT Diagnosis: Difficulty walking;Generalized weakness;Acute pain  PT Problem List: Decreased strength;Decreased range of motion;Decreased activity tolerance;Decreased balance;Decreased mobility;Decreased knowledge of use of DME;Decreased knowledge of precautions;Pain PT Treatment Interventions: DME instruction;Gait training;Stair training;Functional mobility training;Therapeutic activities;Therapeutic exercise;Balance training;Patient/family education   PT Goals Acute Rehab PT Goals PT Goal Formulation: With patient Time For Goal Achievement: 11/12/12 Potential to Achieve Goals: Good Pt will go Supine/Side to Sit: with supervision PT Goal: Supine/Side to Sit - Progress: Goal set today Pt will go Sit to Supine/Side: with supervision PT Goal: Sit to Supine/Side - Progress: Goal set today Pt will go Sit to Stand: with supervision PT Goal: Sit to Stand - Progress: Goal set today Pt will go Stand to Sit: with supervision PT Goal: Stand to Sit - Progress: Goal set today Pt will Ambulate: 51 - 150 feet;with supervision;with least restrictive assistive device PT Goal: Ambulate - Progress: Goal set today Pt will Go Up / Down Stairs: 1-2 stairs;with min assist;with rail(s);with least restrictive assistive device PT Goal: Up/Down Stairs - Progress: Goal set today  Visit Information  Last PT Received On: 11/08/12 Assistance  Needed: +1    Subjective  Data  Subjective: The grapes are here! Patient Stated Goal: to return home.    Prior Functioning  Home Living Lives With: Family Available Help at Discharge: Family;Available 24 hours/day Type of Home: House Home Access: Stairs to enter [employer] of Steps: 2 Entrance Stairs-Rails: Right Home Layout: One level Bathroom Shower/Tub: Engineer, manufacturing systems: Standard Home Adaptive Equipment: Straight cane;Bedside commode/3-in-1;Walker - four wheeled Prior Function Level of Independence: Independent Able to Take Stairs?: Yes Driving: Yes Vocation: Full time employment Communication Communication: No difficulties    Cognition  Cognition Overall Cognitive Status: Appears within functional limits for tasks assessed/performed Arousal/Alertness: Awake/alert Orientation Level: Appears intact for tasks assessed Behavior During Session: [facility] for tasks performed    Extremity/Trunk Assessment Right Lower Extremity Assessment RLE ROM/Strength/Tone: [facility] for tasks assessed RLE Sensation: WFL - Light Touch Left Lower Extremity Assessment LLE ROM/Strength/Tone: Deficits LLE ROM/Strength/Tone Deficits: ankle motions WFL, unable to perform SLR without assist.  LLE Sensation: WFL - Light Touch Trunk Assessment Trunk Assessment: Normal   Balance    End of Session PT - End of Session Equipment Utilized During Treatment: Gait belt;Left knee immobilizer Activity Tolerance: Other (comment) (some dizziness) Patient left: in chair;with call bell/phone within reach Nurse Communication: Mobility status CPM Left Knee CPM Left Knee: Off  GP     Vista Deck 11/08/2012, 4:17 PM

## 2012-11-08 NOTE — Plan of Care (Signed)
Problem: Consults Goal: Diagnosis- Total Joint Replacement Outcome: Completed/Met Date Met:  11/08/12 Primary Total Knee

## 2012-11-08 NOTE — Interval H&P Note (Signed)
History and Physical Interval Note:  11/08/2012 6:53 AM  Jeffrey Goodman  has presented today for surgery, with the diagnosis of Osteoarthritis of the Left Knee  The various methods of treatment have been discussed with the patient and family. After consideration of risks, benefits and other options for treatment, the patient has consented to  Procedure(s): TOTAL KNEE ARTHROPLASTY (Left) as a surgical intervention .  The patient's history has been reviewed, patient examined, no change in status, stable for surgery.  I have reviewed the patient's chart and labs.  Questions were answered to the patient's satisfaction.     Loanne Drilling

## 2012-11-08 NOTE — Progress Notes (Signed)
Utilization review completed.  

## 2012-11-09 ENCOUNTER — Encounter (HOSPITAL_COMMUNITY): Payer: Self-pay | Admitting: Orthopedic Surgery

## 2012-11-09 LAB — BASIC METABOLIC PANEL
BUN: 9 mg/dL (ref 6–23)
GFR calc Af Amer: >90 mL/min (ref >90)
GFR calc non Af Amer: >90 mL/min (ref >90)
Potassium: 3.1 mEq/L — ABNORMAL LOW (ref 3.5–5.1)
Sodium: 132 mEq/L — ABNORMAL LOW (ref 135–145)

## 2012-11-09 LAB — CBC
MCHC: 35.1 g/dL (ref 30.0–36.0)
RDW: 12.6 % (ref 11.5–15.5)

## 2012-11-09 MED ORDER — NON FORMULARY: 20.0000 mg | Freq: Every day | Status: DC

## 2012-11-09 MED ORDER — OMEPRAZOLE 20 MG PO CPDR
20.0000 mg | DELAYED_RELEASE_CAPSULE | Freq: Every day | ORAL | Status: DC
  Administered 2012-11-09 – 2012-11-10 (×2): 20 mg via ORAL
  Filled 2012-11-09 (×2): qty 1

## 2012-11-09 NOTE — Progress Notes (Signed)
   Subjective: 1 Day Post-Op Procedure(s) (LRB): TOTAL KNEE ARTHROPLASTY (Left) Patient reports pain as moderate last night.  A little better this morning. Patient seen in rounds with Dr. Lequita Halt. Patient is well, and has had no acute complaints or problems We will start therapy today.  Plan is to go Home after hospital stay.  Objective: Vital signs in last 24 hours: Temp:  [97.4 F (36.3 C)-99.8 F (37.7 C)] 98.1 F (36.7 C) (03/04 0730) Pulse Rate:  [49-96] 80 (03/04 0730) Resp:  [8-18] 16 (03/04 0730) BP: (141-197)/(77-119) 197/119 mmHg (03/04 0730) SpO2:  [95 %-100 %] 98 % (03/04 0730) Weight:  [105.235 kg (232 lb)] 105.235 kg (232 lb) (03/03 2005)  Intake/Output from previous day:  Intake/Output Summary (Last 24 hours) at 11/09/12 0907 Last data filed at 11/09/12 0700  Gross per 24 hour  Intake 4548.33 ml  Output   5290 ml  Net -741.67 ml    Intake/Output this shift:    Labs:  Recent Labs  11/09/12 0420  HGB 13.0    Recent Labs  11/09/12 0420  WBC 11.4*  RBC 4.04*  HCT 37.0*  PLT 187    Recent Labs  11/09/12 0420  NA 132*  K 3.1*  CL 98  CO2 23  BUN 9  CREATININE 0.77  GLUCOSE 155*  CALCIUM 8.5   No results found for this basename: LABPT, INR,  in the last 72 hours  EXAM General - Patient is Alert, Appropriate and Oriented Extremity - Neurovascular intact Sensation intact distally Dorsiflexion/Plantar flexion intact Dressing - dressing C/D/I Motor Function - intact, moving foot and toes well on exam.  Hemovac pulled without difficulty.  Past Medical History  Diagnosis Date  . Hypertension   . Kidney stones   . GERD (gastroesophageal reflux disease)   . Arthritis     OA BOTH KNEES  . Sinusitis, chronic     Assessment/Plan: 1 Day Post-Op Procedure(s) (LRB): TOTAL KNEE ARTHROPLASTY (Left) Principal Problem:   OA (osteoarthritis) of knee  Estimated body mass index is 31.02 kg/(m^2) as calculated from the following:   Height  as of this encounter: 6' 0.52" (1.842 m).   Weight as of this encounter: 105.235 kg (232 lb). Advance diet Up with therapy Plan for discharge tomorrow Discharge home with home health  DVT Prophylaxis - Xarelto Weight-Bearing as tolerated to left leg No vaccines. D/C O2 and Pulse OX and try on Room Air  PERKINS, ALEXZANDREW 11/09/2012, 9:07 AM

## 2012-11-09 NOTE — Care Management Note (Signed)
    Page 1 of 1   11/09/2012     9:59:29 AM   CARE MANAGEMENT NOTE 11/09/2012  Patient:  DESTIN, VINSANT   Account Number:  [id number]  Date Initiated:  11/09/2012  Documentation initiated by:  Lorenda Ishihara  Subjective/Objective Assessment:   62 yo male admitted s/p total knee arthroplasty. PTA lived at home with family.     Action/Plan:   Home when stabel   Anticipated DC Date:  11/10/2012   Anticipated DC Plan:  HOME W HOME HEALTH SERVICES      DC Planning Services  CM consult      PAC Choice  DURABLE MEDICAL EQUIPMENT  HOME HEALTH   Choice offered to / List presented to:  C-1 Patient   DME arranged  Levan Hurst      DME agency  [facility]     [facility] arranged  HH-2 PT      Status of service:  Completed, signed off Medicare Important Message given?   (If response is "NO", the following Medicare IM given date fields will be blank) Date Medicare IM given:   Date Additional Medicare IM given:    Discharge Disposition:  HOME W HOME HEALTH SERVICES  Per UR Regulation:  Reviewed for med. necessity/level of care/duration of stay  If discussed at Long Length of Stay Meetings, dates discussed:    Comments:

## 2012-11-09 NOTE — Evaluation (Signed)
Occupational Therapy Evaluation Patient Details Name: TIAGO HUMPHREY MRN: [id number] DOB: [birth year] Today's Date: 11/09/2012 Time: 1308-6578 OT Time Calculation (min): 35 min  OT Assessment / Plan / Recommendation Clinical Impression  Pt is a 62 y/o s/p L TKA. He is currently Min assist for transfers and lower body bathe/dressing. Functional mobility w/ Min guard-supervision. He reports that he has DME and will have family assist at d/c. Recommend acute OT for pt ed/performance LB dressing & transfers to maximize independence prior to d/c home w/ family assist.    OT Assessment  Patient needs continued OT Services    Follow Up Recommendations  No OT follow up    Barriers to Discharge      Equipment Recommendations  None recommended by OT    Recommendations for Other Services    Frequency  Min 2X/week    Precautions / Restrictions Precautions Precautions: Knee Required Braces or Orthoses: Knee Immobilizer - Left Knee Immobilizer - Left: Discontinue once straight leg raise with < 10 degree lag Restrictions Weight Bearing Restrictions: No Other Position/Activity Restrictions: WBAT   Pertinent Vitals/Pain Pt rated L knee pain 5/10. Reports that he was pre medicated. Increased activity and repositioning, ice packs reapplied as well after session.    ADL  Grooming: Performed;Wash/dry hands;Wash/dry face Where Assessed - Grooming: Unsupported sitting;Supported sitting Upper Body Bathing: Simulated;Set up Where Assessed - Upper Body Bathing: Supported sitting Lower Body Bathing: Simulated;Minimal assistance;Set up Where Assessed - Lower Body Bathing: Supported sit to stand;Supported sitting Upper Body Dressing: Performed;Set up Where Assessed - Upper Body Dressing: Unsupported sitting Lower Body Dressing: Performed;Minimal assistance Where Assessed - Lower Body Dressing: Supine, head of bed flat;Unsupported sitting Toilet Transfer: Performed;Min  Agricultural engineer Method: Sit to Barista: Raised toilet seat with arms (or 3-in-1 over toilet) Toileting - Clothing Manipulation and Hygiene: Performed;Supervision/safety;Min guard Where Assessed - Toileting Clothing Manipulation and Hygiene: Sit to stand from 3-in-1 or toilet;Sit on 3-in-1 or toilet Tub/Shower Transfer Method: Not assessed Equipment Used: Knee Immobilizer;Rolling walker;Other (comment) (3:1 over toilet) Transfers/Ambulation Related to ADLs: Pt performed functional mobility and toilet transfer w/ Min guard/supervision level assist, VC's for hand placement, safety (Pt tends to attempt to pull up on RW during sit-stand from bed, 3:1 chair etc, as seen in eval today). ADL Comments: Pt is a 62 y/o s/p L TKA. He is currently Min assist for transfers and lower body bathe/dressing. Functional mobility w/ Min guard-supervision. He reports that he has DME and will have family assist at d/c. Recommend acute OT for pt ed/performance LB dressing & transfers to maximize independence prior to d/c home.    OT Diagnosis: Acute pain  OT Problem List: Decreased knowledge of use of DME or AE;Decreased knowledge of precautions;Pain OT Treatment Interventions: Self-care/ADL training;Therapeutic activities;DME and/or AE instruction;Patient/family education   OT Goals Acute Rehab OT Goals OT Goal Formulation: With patient Potential to Achieve Goals: Good ADL Goals Pt Will Perform Grooming: with supervision;Standing at sink ADL Goal: Grooming - Progress: Goal set today Pt Will Perform Lower Body Dressing: with min assist;with supervision;Sitting, chair;Sitting, bed;Sit to stand from chair;Sit to stand from bed;Unsupported;with adaptive equipment (A/E PRN) ADL Goal: Lower Body Dressing - Progress: Goal set today Pt Will Transfer to Toilet: with supervision;Ambulation;with DME;3-in-1 ADL Goal: Toilet Transfer - Progress: Goal set today Pt Will Perform  Toileting - Clothing Manipulation: with supervision;Sitting on 3-in-1 or toilet;Standing ADL Goal: Toileting - Clothing Manipulation - Progress: Goal set today Pt Will Perform Toileting - Hygiene:  with modified independence;Sitting on 3-in-1 or toilet ADL Goal: Toileting - Hygiene - Progress: Goal set today Pt Will Perform Tub/Shower Transfer: Tub transfer;with min assist;Ambulation;with DME ADL Goal: Tub/Shower Transfer - Progress: Goal set today  Visit Information  Last OT Received On: 11/09/12 Assistance Needed: +1    Subjective Data  Subjective: Pt plans to return home w/ family assist   Prior Functioning     Home Living Lives With: Family Available Help at Discharge: Family;Available 24 hours/day Type of Home: House Home Access: Stairs to enter [employer] of Steps: 2 Entrance Stairs-Rails: Right Home Layout: One level Bathroom Shower/Tub: Engineer, manufacturing systems: Standard Home Adaptive Equipment: Straight cane;Bedside commode/3-in-1;Walker - four wheeled Prior Function Level of Independence: Independent Able to Take Stairs?: Yes Driving: Yes Vocation: Full time employment Communication Communication: No difficulties Dominant Hand: Right    Vision/Perception Vision - History Baseline Vision: Wears glasses all the time Patient Visual Report: No change from baseline   Cognition  Cognition Overall Cognitive Status: Appears within functional limits for tasks assessed/performed Arousal/Alertness: Awake/alert Orientation Level: Appears intact for tasks assessed Behavior During Session: [facility] for tasks performed    Extremity/Trunk Assessment   Pt reports h/o scoliosis, and right knee arthritis.   Mobility Bed Mobility Bed Mobility: Supine to Sit;Sitting - Scoot to Edge of Bed Supine to Sit: 4: Min assist;HOB elevated Sitting - Scoot to [place] of Bed: 4: Min guard Details for Bed Mobility Assistance: Assist for LLE out of bed with cues for hand  placement to self assist.   Transfers Transfers: Sit to Stand;Stand to Sit Sit to Stand: From elevated surface;With upper extremity assist;From bed;From chair/3-in-1;4: Min assist Stand to Sit: With armrests;To chair/3-in-1;4: Min guard;5: Supervision;Without upper extremity assist Details for Transfer Assistance: cues for hand placement and LE management when sitting/standing.  Requires assist to steady when in standing and some for LLE when sitting, tends attempt to pull up on RW during transfers.        Balance Balance Balance Assessed: Yes Static Sitting Balance Static Sitting - Balance Support: Bilateral upper extremity supported;Feet supported Dynamic Sitting Balance Dynamic Sitting - Balance Support: No upper extremity supported;Feet supported   End of Session OT - End of Session Equipment Utilized During Treatment: Left knee immobilizer;Other (comment) (RW, 3:1 over toilet) Activity Tolerance: Patient tolerated treatment well Patient left: in chair;with call bell/phone within reach  GO     Alm Bustard 11/09/2012, 11:42 AM

## 2012-11-09 NOTE — Progress Notes (Signed)
Physical Therapy Treatment Patient Details Name: Jeffrey Goodman MRN: [id number] DOB: 1950/12/05 Today's Date: 11/09/2012 Time: 7829-5621 PT Time Calculation (min): 48 min  PT Assessment / Plan / Recommendation Comments on Treatment Session  improved distance, instructed pt on how to prop L Knee to prevent external rotation/knee flex.plans DC tomorrow.    Follow Up Recommendations  Home health PT     Does the patient have the potential to tolerate intense rehabilitation     Barriers to Discharge        Equipment Recommendations  Rolling walker with 5" wheels    Recommendations for Other Services    Frequency 7X/week   Plan Discharge plan remains appropriate    Precautions / Restrictions Precautions Precautions: Knee Required Braces or Orthoses: Knee Immobilizer - Left Knee Immobilizer - Left: Discontinue once straight leg raise with < 10 degree lag   Pertinent Vitals/Pain 6,     Mobility  Bed Mobility Bed Mobility: Sit to Supine Supine to Sit: 4: Min assist;HOB elevated Sitting - Scoot to Edge of Bed: 4: Min guard Sit to Supine: 4: Min assist Details for Bed Mobility Assistance: use of sheet to lift leg onto bed with min guard, cues for technique. Transfers Transfers: Sit to Stand;Stand to Sit Sit to Stand: From elevated surface;With upper extremity assist;From chair/3-in-1;4: Min assist Stand to Sit: 4: Min guard;5: Supervision;To bed;With upper extremity assist Details for Transfer Assistance: cues for hand placement and LE management when sitting/standing.  Requires assist to steady when in standing and some for LLE when sitting, tends attempt to pull up on RW during transfers. Ambulation/Gait Ambulation/Gait Assistance: 4: Min guard Ambulation Distance (Feet): 120 Feet Assistive device: Rolling walker Ambulation/Gait Assistance Details: cues for posture, sequence, step lemgth Gait Pattern: Step-to pattern;Decreased stride length;Antalgic;Trunk flexed Gait  velocity: decreased    Exercises Total Joint Exercises Quad Sets: AROM;Left;10 reps;Supine Heel Slides: AAROM;Left;10 reps;Supine Straight Leg Raises: AAROM;Left;10 reps;Supine   PT Diagnosis:    PT Problem List:   PT Treatment Interventions:     PT Goals Acute Rehab PT Goals Pt will go Supine/Side to Sit: with supervision PT Goal: Supine/Side to Sit - Progress: Progressing toward goal Pt will go Sit to Supine/Side: with supervision PT Goal: Sit to Supine/Side - Progress: Progressing toward goal Pt will go Sit to Stand: with supervision PT Goal: Sit to Stand - Progress: Progressing toward goal Pt will go Stand to Sit: with supervision PT Goal: Stand to Sit - Progress: Progressing toward goal Pt will Ambulate: 51 - 150 feet;with supervision;with least restrictive assistive device PT Goal: Ambulate - Progress: Progressing toward goal  Visit Information  Last PT Received On: 11/09/12 Assistance Needed: +1    Subjective Data  Subjective: I am putting more weight on my leg.   Cognition  Cognition Overall Cognitive Status: Appears within functional limits for tasks assessed/performed    Balance     End of Session PT - End of Session Equipment Utilized During Treatment: Left knee immobilizer Activity Tolerance: Patient tolerated treatment well Patient left: in bed;with call bell/phone within reach;with family/visitor present Nurse Communication: Mobility status, need for meds    GP     Rada Hay 11/09/2012, 4:24 PM

## 2012-11-09 NOTE — Progress Notes (Signed)
Physical Therapy Treatment Patient Details Name: Jeffrey Goodman MRN: [id number] DOB: 1950-09-13 Today's Date: 11/09/2012 Time: 0930-1005 PT Time Calculation (min): 35 min  PT Assessment / Plan / Recommendation Comments on Treatment Session  Pt. improved in ambulation, encouraged to keep L knee straight when in bed.    Follow Up Recommendations  Home health PT     Does the patient have the potential to tolerate intense rehabilitation     Barriers to Discharge        Equipment Recommendations  Rolling walker with 5" wheels    Recommendations for Other Services    Frequency 7X/week   Plan Discharge plan remains appropriate    Precautions / Restrictions Precautions Precautions: Knee Required Braces or Orthoses: Knee Immobilizer - Left Knee Immobilizer - Left: Discontinue once straight leg raise with < 10 degree lag   Pertinent Vitals/Pain 3.5 L knee, thigh hurts more.    Mobility  Bed Mobility Bed Mobility: Supine to Sit;Sitting - Scoot to Edge of Bed Supine to Sit: 4: Min assist;HOB elevated Sitting - Scoot to [place] of Bed: 4: Min guard Details for Bed Mobility Assistance: Assist for LLE out of bed with cues for hand placement to self assist.   Transfers Transfers: Sit to Stand;Stand to Sit Sit to Stand: From elevated surface;With upper extremity assist;From bed;From chair/3-in-1;4: Min assist Stand to Sit: With armrests;To chair/3-in-1;4: Min guard;5: Supervision;Without upper extremity assist Details for Transfer Assistance: cues for hand placement and LE management when sitting/standing.  Requires assist to steady when in standing and some for LLE when sitting, tends attempt to pull up on RW during transfers. Ambulation/Gait Ambulation/Gait Assistance: 4: Min assist Ambulation Distance (Feet): 60 Feet Assistive device: Rolling walker Ambulation/Gait Assistance Details: cues for posture, sequence, step lemgth Gait Pattern: Step-to pattern;Decreased stride  length;Antalgic;Trunk flexed Gait velocity: decreased    Exercises     PT Diagnosis:    PT Problem List:   PT Treatment Interventions:     PT Goals Acute Rehab PT Goals Pt will go Supine/Side to Sit: with supervision PT Goal: Supine/Side to Sit - Progress: Progressing toward goal Pt will go Sit to Stand: with supervision PT Goal: Sit to Stand - Progress: Progressing toward goal Pt will go Stand to Sit: with supervision PT Goal: Stand to Sit - Progress: Progressing toward goal Pt will Ambulate: 51 - 150 feet;with supervision;with least restrictive assistive device PT Goal: Ambulate - Progress: Progressing toward goal  Visit Information  Last PT Received On: 11/09/12 Assistance Needed: +1    Subjective Data  Subjective: The grapes are here!   Cognition  Cognition Overall Cognitive Status: Appears within functional limits for tasks assessed/performed    Balance     End of Session PT - End of Session Equipment Utilized During Treatment: Left knee immobilizer Activity Tolerance: Patient tolerated treatment well Patient left: in chair;with call bell/phone within reach Nurse Communication: Mobility status    GP     Rada Hay 11/09/2012, 4:17 PM

## 2012-11-10 LAB — CBC
Hemoglobin: 12.4 g/dL — ABNORMAL LOW (ref 13.0–17.0)
MCHC: 34.8 g/dL (ref 30.0–36.0)
RBC: 3.86 MIL/uL — ABNORMAL LOW (ref 4.22–5.81)

## 2012-11-10 LAB — BASIC METABOLIC PANEL
GFR calc Af Amer: >90 mL/min (ref >90)
GFR calc non Af Amer: >90 mL/min (ref >90)
Glucose, Bld: 190 mg/dL — ABNORMAL HIGH (ref 70–99)
Potassium: 3.6 mEq/L (ref 3.5–5.1)
Sodium: 135 mEq/L (ref 135–145)

## 2012-11-10 MED ORDER — METHOCARBAMOL 500 MG PO TABS: 500.0000 mg | ORAL_TABLET | Freq: Four times a day (QID) | ORAL | Status: DC | PRN

## 2012-11-10 MED ORDER — OXYCODONE HCL 5 MG PO TABS: 5.0000 mg | ORAL_TABLET | ORAL | Status: DC | PRN

## 2012-11-10 MED ORDER — RIVAROXABAN 10 MG PO TABS: 10.0000 mg | ORAL_TABLET | Freq: Every day | ORAL | Status: DC

## 2012-11-10 MED ORDER — TRAMADOL HCL 50 MG PO TABS: 50.0000 mg | ORAL_TABLET | Freq: Four times a day (QID) | ORAL | Status: DC | PRN

## 2012-11-10 NOTE — Progress Notes (Signed)
Physical Therapy Treatment Patient Details Name: Jeffrey Goodman MRN: [id number] DOB: 1950-12-20 Today's Date: 11/10/2012 Time: 4098-1191 PT Time Calculation (min): 22 min  PT Assessment / Plan / Recommendation Comments on Treatment Session  Pt. states he is very tired. all instruction completed. Pt. ready for DC. Brother aware of assist on steps.    Follow Up Recommendations  Home health PT     Does the patient have the potential to tolerate intense rehabilitation     Barriers to Discharge        Equipment Recommendations  Rolling walker with 5" wheels    Recommendations for Other Services    Frequency 7X/week   Plan Discharge plan remains appropriate    Precautions / Restrictions Precautions Precautions: Knee Required Braces or Orthoses: Knee Immobilizer - Left   Pertinent Vitals/Pain 5 thigh.   Mobility    Exercises Total Joint Exercises Quad Sets: AROM;Left;10 reps;Supine Short Arc Quad: AAROM;Left;10 reps;Seated Straight Leg Raises: AAROM;Left;10 reps;Supine Knee Flexion: AAROM;Left;10 reps;Seated   PT Diagnosis:    PT Problem List:   PT Treatment Interventions:     PT Goals Acute Rehab PT Goals Pt will go Sit to Stand: with supervision PT Goal: Sit to Stand - Progress: Progressing toward goal Pt will go Stand to Sit: with supervision PT Goal: Stand to Sit - Progress: Met Pt will Ambulate: 51 - 150 feet;with supervision;with least restrictive assistive device Pt will Go Up / Down Stairs: 1-2 stairs;with min assist;with rail(s);with least restrictive assistive device PT Goal: Up/Down Stairs - Progress: Met  Visit Information  Last PT Received On: 11/10/12 Assistance Needed: +1    Subjective Data  Subjective: i will have to let my thigh get better.   Cognition  Cognition Overall Cognitive Status: Appears within functional limits for tasks assessed/performed Behavior During Session: Restless    Balance     End of Session PT - End of  Session Equipment Utilized During Treatment: Left knee immobilizer Activity Tolerance: Patient limited by fatigue Patient left: with call bell/phone within reach;with family/visitor present;in chair Nurse Communication: Mobility status (ready to DC)   GP     Rada Hay 11/10/2012, 3:17 PM

## 2012-11-10 NOTE — Progress Notes (Signed)
   Subjective: 2 Days Post-Op Procedure(s) (LRB): TOTAL KNEE ARTHROPLASTY (Left) Patient reports pain as mild.   Patient seen in rounds for Dr. Lequita Halt. Blood pressure running better.  Pain is improved. Patient is well, and has had no acute complaints or problems Patient is ready to go home today.  Objective: Vital signs in last 24 hours: Temp:  [98.1 F (36.7 C)-99.8 F (37.7 C)] 98.3 F (36.8 C) (03/05 0600) Pulse Rate:  [69-100] 69 (03/05 0600) Resp:  [16] 16 (03/05 0600) BP: (148-183)/(86-109) 148/86 mmHg (03/05 0600) SpO2:  [92 %-97 %] 92 % (03/05 0600)  Intake/Output from previous day:  Intake/Output Summary (Last 24 hours) at 11/10/12 0745 Last data filed at 11/10/12 0700  Gross per 24 hour  Intake   3480 ml  Output   3540 ml  Net    -60 ml    Intake/Output this shift:    Labs:  Recent Labs  11/09/12 0420 11/10/12 0410  HGB 13.0 12.4*    Recent Labs  11/09/12 0420 11/10/12 0410  WBC 11.4* 17.6*  RBC 4.04* 3.86*  HCT 37.0* 35.6*  PLT 187 198    Recent Labs  11/09/12 0420 11/10/12 0410  NA 132* 135  K 3.1* 3.6  CL 98 99  CO2 23 24  BUN 9 11  CREATININE 0.77 0.86  GLUCOSE 155* 190*  CALCIUM 8.5 9.2   No results found for this basename: LABPT, INR,  in the last 72 hours  EXAM: General - Patient is Alert, Appropriate and Oriented Extremity - Neurovascular intact Sensation intact distally Dorsiflexion/Plantar flexion intact No cellulitis present Incision - clean, dry, no drainage, healing Motor Function - intact, moving foot and toes well on exam.   Assessment/Plan: 2 Days Post-Op Procedure(s) (LRB): TOTAL KNEE ARTHROPLASTY (Left) Procedure(s) (LRB): TOTAL KNEE ARTHROPLASTY (Left) Past Medical History  Diagnosis Date  . Hypertension   . Kidney stones   . GERD (gastroesophageal reflux disease)   . Arthritis     OA BOTH KNEES  . Sinusitis, chronic    Principal Problem:   OA (osteoarthritis) of knee  Estimated body mass index  is 31.02 kg/(m^2) as calculated from the following:   Height as of this encounter: 6' 0.52" (1.842 m).   Weight as of this encounter: 105.235 kg (232 lb). Up with therapy Discharge home with home health Diet - Cardiac diet Follow up - in 2 weeks Activity - WBAT Disposition - Home Condition Upon Discharge - Good D/C Meds - See DC Summary DVT Prophylaxis - Xarelto  PERKINS, ALEXZANDREW 11/10/2012, 7:45 AM

## 2012-11-10 NOTE — Progress Notes (Signed)
Occupational Therapy Treatment Patient Details Name: Jeffrey Goodman MRN: [id number] DOB: [birth year] Today's Date: 11/10/2012 Time: 6213-0865 OT Time Calculation (min): 38 min  OT Assessment / Plan / Recommendation Comments on Treatment Session Pt moves slowly during functional mobility/transfers, however given increased time for tasks, he is supervision level w/ cues for hand placement. Pt is Min assist LB bathe/dress.  A/E handout was issued/reviewed & pt reports family will assist w/o A/E. He plans to sponge bathe at sink initially & d/c home later today. He reports no further OT needs at this time.    Follow Up Recommendations  No OT follow up;Supervision/Assistance - 24 hour    Barriers to Discharge       Equipment Recommendations  None recommended by OT    Recommendations for Other Services    Frequency     Plan Discharge plan remains appropriate    Precautions / Restrictions Precautions Precautions: Knee Required Braces or Orthoses: Knee Immobilizer - Left Knee Immobilizer - Left: Discontinue once straight leg raise with < 10 degree lag Restrictions Weight Bearing Restrictions: No Other Position/Activity Restrictions: WBAT   Pertinent Vitals/Pain 1-2/10 LLE prior to treatment session.   ADL  Eating/Feeding: Performed;Independent Where Assessed - Eating/Feeding: Bed level Grooming: Performed;Wash/dry hands;Supervision/safety Where Assessed - Grooming: Unsupported standing Upper Body Dressing: Performed;Other (comment);Set up (Issued and reviewed A/E handout w/ pt) Lower Body Dressing: Performed;Set up;Other (comment) (Issued and reviewed A/E handout w/ pt) Toilet Transfer: Performed;Supervision/safety;Other (comment) (VC's for safety/sequencing) Toilet Transfer Method: Sit to stand Toilet Transfer Equipment: Raised toilet seat with arms (or 3-in-1 over toilet) Toileting - Clothing Manipulation and Hygiene: Performed;Supervision/safety;Other (comment) (VC's for safety  and sequencing) Where Assessed - Toileting Clothing Manipulation and Hygiene: Sit on 3-in-1 or toilet Tub/Shower Transfer Method: Not assessed;Other (comment) (Pt reports that he plans to sponge bathe at sink initially) Transfers/Ambulation Related to ADLs: Pt moves slowly during functional mobility in room and during toilet transfer, however given increased time for tasks, pt was close supervision level. ADL Comments: Pt continues to move slowly during functional mobility and transfers, but given increased time for tasks, he was able to complete at close supervison level. He plans for family assist at discharge and when A/E was reviewed w/ pt, he reports that family will perform  PRN w/o A/E. He reports no further concerns related to OT>    OT Diagnosis:    OT Problem List:   OT Treatment Interventions:     OT Goals ADL Goals ADL Goal: Grooming - Progress: Met ADL Goal: Lower Body Dressing - Progress: Partly met ADL Goal: Toilet Transfer - Progress: Met ADL Goal: Toileting - Clothing Manipulation - Progress: Partly met ADL Goal: Toileting - Hygiene - Progress: Met ADL Goal: Tub/Shower Transfer - Progress: Progressing toward goals  Visit Information  Last OT Received On: 11/10/12    Subjective Data  Subjective: "My brother will stay overnight if you think he has to" Patient Stated Goal: D/C home later today   Prior Functioning    Independent   Cognition  Cognition Overall Cognitive Status: Appears within functional limits for tasks assessed/performed Arousal/Alertness: Awake/alert Orientation Level: Appears intact for tasks assessed Behavior During Session: [facility] for tasks performed    Mobility  Bed Mobility Bed Mobility: Supine to Sit;Sitting - Scoot to Edge of Bed Supine to Sit: 4: Min guard (Assist for L LE) Sitting - Scoot to Edge of Bed: 5: Supervision;4: Min guard Details for Bed Mobility Assistance: Discussed use of hooking R ankle/foot under left  to assist in transfer  off of bed. Transfers Transfers: Sit to Stand;Stand to Sit Sit to Stand: 5: Supervision (VC's for hand placement) Stand to Sit: 5: Supervision (VC's for hand placement) Details for Transfer Assistance: cues for hand placement and LE management when sitting/standing.            End of Session OT - End of Session Equipment Utilized During Treatment: Left knee immobilizer;Other (comment) (RW, 3:1 over toilet) Activity Tolerance: Patient tolerated treatment well Patient left: in chair;with call bell/phone within reach CPM Left Knee CPM Left Knee: Off  GO     Alm Bustard 11/10/2012, 9:41 AM

## 2012-11-10 NOTE — Discharge Summary (Signed)
Physician Discharge Summary   Patient ID: Jeffrey Goodman MRN: [id number] DOB/AGE: [birth year] 62 y.o.  Admit date: 11/08/2012 Discharge date: 11/10/2012  Primary Diagnosis:  Osteoarthritis Left knee  Admission Diagnoses:  Past Medical History  Diagnosis Date  . Hypertension   . Kidney stones   . GERD (gastroesophageal reflux disease)   . Arthritis     OA BOTH KNEES  . Sinusitis, chronic    Discharge Diagnoses:   Principal Problem:   OA (osteoarthritis) of knee  Estimated body mass index is 31.02 kg/(m^2) as calculated from the following:   Height as of this encounter: 6' 0.52" (1.842 m).   Weight as of this encounter: 105.235 kg (232 lb).  Classification of overweight in adults according to BMI (WHO, 1998)   Procedure:  Procedure(s) (LRB): TOTAL KNEE ARTHROPLASTY (Left)   Consults: None  HPI: [name] is a 62 y.o. year old male with end stage OA of his left knee with progressively worsening pain and dysfunction. He has constant pain, with activity and at rest and significant functional deficits with difficulties even with ADLs. He has had extensive non-op management including analgesics, injections of cortisone and viscosupplements, and home exercise program, but remains in significant pain with significant dysfunction. Radiographs show bone on bone arthritis medial and patellofemoral with large varus deformity. He presents now for left Total Knee Arthroplasty.  Laboratory Data: Admission on 11/08/2012  Component Date Value Range Status  . WBC 11/09/2012 11.4* 4.0 - 10.5 K/uL Final  . RBC 11/09/2012 4.04* 4.22 - 5.81 MIL/uL Final  . Hemoglobin 11/09/2012 13.0  13.0 - 17.0 g/dL Final  . HCT 40/98/1191 37.0* 39.0 - 52.0 % Final  . MCV 11/09/2012 91.6  78.0 - 100.0 fL Final  . MCH 11/09/2012 32.2  26.0 - 34.0 pg Final  . MCHC 11/09/2012 35.1  30.0 - 36.0 g/dL Final  . RDW 47/82/9562 12.6  11.5 - 15.5 % Final  . Platelets 11/09/2012 187  150 - 400 K/uL Final  .  Sodium 11/09/2012 132* 135 - 145 mEq/L Final  . Potassium 11/09/2012 3.1* 3.5 - 5.1 mEq/L Final  . Chloride 11/09/2012 98  96 - 112 mEq/L Final  . CO2 11/09/2012 23  19 - 32 mEq/L Final  . Glucose, Bld 11/09/2012 155* 70 - 99 mg/dL Final  . BUN 13/04/6577 9  6 - 23 mg/dL Final  . Creatinine, Ser 11/09/2012 0.77  0.50 - 1.35 mg/dL Final  . Calcium 46/96/2952 8.5  8.4 - 10.5 mg/dL Final  . GFR calc non Af Amer 11/09/2012 >90  >90 mL/min Final  . GFR calc Af Amer 11/09/2012 >90  >90 mL/min Final   Comment:                                 The eGFR has been calculated                          using the CKD EPI equation.                          This calculation has not been                          validated in all clinical  situations.                          eGFR's persistently                          <90 mL/min signify                          possible Chronic Kidney Disease.  . WBC 11/10/2012 17.6* 4.0 - 10.5 K/uL Final  . RBC 11/10/2012 3.86* 4.22 - 5.81 MIL/uL Final  . Hemoglobin 11/10/2012 12.4* 13.0 - 17.0 g/dL Final  . HCT 21/30/8657 35.6* 39.0 - 52.0 % Final  . MCV 11/10/2012 92.2  78.0 - 100.0 fL Final  . MCH 11/10/2012 32.1  26.0 - 34.0 pg Final  . MCHC 11/10/2012 34.8  30.0 - 36.0 g/dL Final  . RDW 84/69/6295 12.5  11.5 - 15.5 % Final  . Platelets 11/10/2012 198  150 - 400 K/uL Final  . Sodium 11/10/2012 135  135 - 145 mEq/L Final  . Potassium 11/10/2012 3.6  3.5 - 5.1 mEq/L Final  . Chloride 11/10/2012 99  96 - 112 mEq/L Final  . CO2 11/10/2012 24  19 - 32 mEq/L Final  . Glucose, Bld 11/10/2012 190* 70 - 99 mg/dL Final  . BUN 28/41/3244 11  6 - 23 mg/dL Final  . Creatinine, Ser 11/10/2012 0.86  0.50 - 1.35 mg/dL Final  . Calcium 09/10/7251 9.2  8.4 - 10.5 mg/dL Final  . GFR calc non Af Amer 11/10/2012 >90  >90 mL/min Final  . GFR calc Af Amer 11/10/2012 >90  >90 mL/min Final   Comment:                                 The eGFR has been  calculated                          using the CKD EPI equation.                          This calculation has not been                          validated in all clinical                          situations.                          eGFR's persistently                          <90 mL/min signify                          possible Chronic Kidney Disease.  Hospital Outpatient Visit on 11/02/2012  Component Date Value Range Status  . MRSA, PCR 11/02/2012 NEGATIVE  NEGATIVE Final  . Staphylococcus aureus 11/02/2012 POSITIVE* NEGATIVE Final   Comment:                                 The Xpert  SA Assay (FDA                          approved for NASAL specimens                          in patients over 60 years of age),                          is one component of                          a comprehensive surveillance                          program.  Test performance has                          been validated by [employer] for patients greater                          than or equal to 60 year old.                          It is not intended                          to diagnose infection nor to                          guide or monitor treatment.  Marland Kitchen aPTT 11/02/2012 35  24 - 37 seconds Final  . WBC 11/02/2012 6.9  4.0 - 10.5 K/uL Final  . RBC 11/02/2012 4.53  4.22 - 5.81 MIL/uL Final  . Hemoglobin 11/02/2012 14.8  13.0 - 17.0 g/dL Final  . HCT 16/06/9603 41.9  39.0 - 52.0 % Final  . MCV 11/02/2012 92.5  78.0 - 100.0 fL Final  . MCH 11/02/2012 32.7  26.0 - 34.0 pg Final  . MCHC 11/02/2012 35.3  30.0 - 36.0 g/dL Final  . RDW 54/05/8118 12.5  11.5 - 15.5 % Final  . Platelets 11/02/2012 225  150 - 400 K/uL Final  . Sodium 11/02/2012 135  135 - 145 mEq/L Final  . Potassium 11/02/2012 4.2  3.5 - 5.1 mEq/L Final  . Chloride 11/02/2012 100  96 - 112 mEq/L Final  . CO2 11/02/2012 26  19 - 32 mEq/L Final  . Glucose, Bld 11/02/2012 96  70 - 99 mg/dL Final  . BUN  14/78/2956 24* 6 - 23 mg/dL Final  . Creatinine, Ser 11/02/2012 1.11  0.50 - 1.35 mg/dL Final  . Calcium 21/30/8657 9.3  8.4 - 10.5 mg/dL Final  . Total Protein 11/02/2012 6.9  6.0 - 8.3 g/dL Final  . Albumin 84/69/6295 4.0  3.5 - 5.2 g/dL Final  . AST 28/41/3244 43* 0 - 37 U/L Final  . ALT 11/02/2012 49  0 - 53 U/L Final  . Alkaline Phosphatase 11/02/2012 82  39 - 117 U/L Final  . Total Bilirubin 11/02/2012 0.6  0.3 - 1.2 mg/dL Final  . GFR  calc non Af Amer 11/02/2012 71* >90 mL/min Final  . GFR calc Af Amer 11/02/2012 82* >90 mL/min Final   Comment:                                 The eGFR has been calculated                          using the CKD EPI equation.                          This calculation has not been                          validated in all clinical                          situations.                          eGFR's persistently                          <90 mL/min signify                          possible Chronic Kidney Disease.  Marland Kitchen Prothrombin Time 11/02/2012 13.8  11.6 - 15.2 seconds Final  . INR 11/02/2012 1.07  0.00 - 1.49 Final  . ABO/RH(D) 11/02/2012 O POS   Final  . Antibody Screen 11/02/2012 NEG   Final  . Sample Expiration 11/02/2012 11/11/2012   Final  . Color, Urine 11/02/2012 AMBER* YELLOW Final   BIOCHEMICALS MAY BE AFFECTED BY COLOR  . APPearance 11/02/2012 CLEAR  CLEAR Final  . Specific Gravity, Urine 11/02/2012 1.035* 1.005 - 1.030 Final  . pH 11/02/2012 5.5  5.0 - 8.0 Final  . Glucose, UA 11/02/2012 NEGATIVE  NEGATIVE mg/dL Final  . Hgb urine dipstick 11/02/2012 NEGATIVE  NEGATIVE Final  . Bilirubin Urine 11/02/2012 SMALL* NEGATIVE Final  . Ketones, ur 11/02/2012 TRACE* NEGATIVE mg/dL Final  . Protein, ur 16/06/9603 NEGATIVE  NEGATIVE mg/dL Final  . Urobilinogen, UA 11/02/2012 1.0  0.0 - 1.0 mg/dL Final  . Nitrite 54/05/8118 NEGATIVE  NEGATIVE Final  . Leukocytes, UA 11/02/2012 NEGATIVE  NEGATIVE Final   MICROSCOPIC NOT DONE ON URINES WITH  NEGATIVE PROTEIN, BLOOD, LEUKOCYTES, NITRITE, OR GLUCOSE <1000 mg/dL.  . ABO/RH(D) 11/02/2012 O POS   Final     X-Rays:Dg Chest 2 View  11/02/2012  *RADIOLOGY REPORT*  Clinical Data: Hypertension; preoperative knee arthroplasty  CHEST - 2 VIEW  Comparison: October 11, 2007  Findings:  Lungs clear.  The heart size and pulmonary vascularity are normal.  No adenopathy.  No bone lesions.  IMPRESSION: No abnormality noted.   Original Report Authenticated By: Bretta Bang, M.D.     EKG: Orders placed during the hospital encounter of 11/02/12  . EKG 12-LEAD  . EKG 12-LEAD     Hospital Course: TEMPLE SPORER is a 62 y.o. who was admitted to [facility]. They were brought to the operating room on 11/08/2012 and underwent Procedure(s): TOTAL KNEE ARTHROPLASTY.  Patient tolerated the procedure well and was later transferred to the recovery room and then to the orthopaedic floor for postoperative care.  They were given PO and IV analgesics for pain control following their surgery.  They were given 24 hours of postoperative antibiotics of  Anti-infectives   Start     Dose/Rate Route Frequency Ordered Stop   11/08/12 1400  ceFAZolin (ANCEF) IVPB 2 g/50 mL premix     2 g 100 mL/hr over 30 Minutes Intravenous Every 6 hours 11/08/12 1137 11/08/12 2034   11/08/12 0645  ceFAZolin (ANCEF) IVPB 2 g/50 mL premix     2 g 100 mL/hr over 30 Minutes Intravenous On call to O.R. 11/08/12 1610 11/08/12 0757     and started on DVT prophylaxis in the form of Xarelto.   PT and OT were ordered for total joint protocol.  Discharge planning consulted to help with postop disposition and equipment needs.  Patient had a rough night on the evening of surgery due to pain.  He also had issues with elevated blood pressures.  He was started back on his home meds.  They started to get up OOB with therapy on day one. Hemovac drain was pulled without difficulty.  Continued to work with therapy into day two.  Dressing was  changed on day two and the incision was healing well and his blood pressure was doing better that next day. Patient was seen in rounds and was ready to go home later that same day.   Discharge Medications: Prior to Admission medications   Medication Sig Start Date End Date Taking? Authorizing Provider  acetaminophen (TYLENOL) 500 MG tablet Take 1,000 mg by mouth every 6 (six) hours as needed for pain.   Yes Historical Provider, MD  diltiazem (CARDIZEM CD) 240 MG 24 hr capsule Take 480 mg by mouth every morning.    Yes Historical Provider, MD  diphenhydrAMINE (BENADRYL) 25 mg capsule Take 50 mg by mouth every 6 (six) hours as needed. itching   Yes Historical Provider, MD  guanFACINE (TENEX) 2 MG tablet Take 2 mg by mouth every evening. FOR HYPERTENSION   Yes Historical Provider, MD  hydrochlorothiazide (HYDRODIURIL) 25 MG tablet Take 25 mg by mouth every morning.    Yes Historical Provider, MD  omeprazole (PRILOSEC) 20 MG capsule Take 20 mg by mouth daily.   Yes Historical Provider, MD  oxymetazoline (AFRIN) 0.05 % nasal spray Place 1 spray into the nose 2 (two) times daily.    Yes Historical Provider, MD  methocarbamol (ROBAXIN) 500 MG tablet Take 1 tablet (500 mg total) by mouth every 6 (six) hours as needed. 11/10/12   Alexzandrew Perkins, PA-C  oxyCODONE (OXY IR/ROXICODONE) 5 MG immediate release tablet Take 1-2 tablets (5-10 mg total) by mouth every 3 (three) hours as needed. 11/10/12   Alexzandrew Julien Girt, PA-C  rivaroxaban (XARELTO) 10 MG TABS tablet Take 1 tablet (10 mg total) by mouth daily with breakfast. Take Xarelto for two and a half more weeks, then discontinue Xarelto. 11/10/12   Alexzandrew Perkins, PA-C  traMADol (ULTRAM) 50 MG tablet Take 1-2 tablets (50-100 mg total) by mouth every 6 (six) hours as needed (mild pain). 11/10/12   Alexzandrew Julien Girt, PA-C    Diet: Cardiac diet Activity:WBAT Follow-up:in 2 weeks Disposition - Home Discharged Condition: good   Discharge Orders    Future Orders Complete By Expires     Call MD / Call 911  As directed     Comments:      If you experience chest pain or shortness of breath, CALL 911 and be transported to the hospital emergency room.  If you  develope a fever above 101 F, pus (white drainage) or increased drainage or redness at the wound, or calf pain, call your surgeon's office.    Change dressing  As directed     Comments:      Change dressing daily with sterile 4 x 4 inch gauze dressing and apply TED hose. Do not submerge the incision under water.    Constipation Prevention  As directed     Comments:      Drink plenty of fluids.  Prune juice may be helpful.  You may use a stool softener, such as Colace (over the counter) 100 mg twice a day.  Use MiraLax (over the counter) for constipation as needed.    Diet - low sodium heart healthy  As directed     Discharge instructions  As directed     Comments:      Pick up stool softner and laxative for home. Do not submerge incision under water. May shower. Continue to use ice for pain and swelling from surgery.  Take Xarelto for two and a half more weeks, then discontinue Xarelto.    Do not put a pillow under the knee. Place it under the heel.  As directed     Do not sit on low chairs, stoools or toilet seats, as it may be difficult to get up from low surfaces  As directed     Driving restrictions  As directed     Comments:      No driving until released by the physician.    Increase activity slowly as tolerated  As directed     Lifting restrictions  As directed     Comments:      No lifting until released by the physician.    Patient may shower  As directed     Comments:      You may shower without a dressing once there is no drainage.  Do not wash over the wound.  If drainage remains, do not shower until drainage stops.    TED hose  As directed     Comments:      Use stockings (TED hose) for 3 weeks on both leg(s).  You may remove them at night for sleeping.    Weight  bearing as tolerated  As directed         Medication List    STOP taking these medications       Co Q 10 100 MG Caps     FISH OIL-KRILL OIL PO     Glucosamine 500 MG Caps     multivitamin with minerals Tabs     naproxen sodium 220 MG tablet  Commonly known as:  ANAPROX     Red Yeast Rice 600 MG Tabs     Vitamin D3 5000 UNITS Caps      TAKE these medications       acetaminophen 500 MG tablet  Commonly known as:  TYLENOL  Take 1,000 mg by mouth every 6 (six) hours as needed for pain.     BENADRYL 25 mg capsule  Generic drug:  diphenhydrAMINE  Take 50 mg by mouth every 6 (six) hours as needed. itching     diltiazem 240 MG 24 hr capsule  Commonly known as:  CARDIZEM CD  Take 480 mg by mouth every morning.     guanFACINE 2 MG tablet  Commonly known as:  TENEX  Take 2 mg by mouth every evening. FOR HYPERTENSION     hydrochlorothiazide  25 MG tablet  Commonly known as:  HYDRODIURIL  Take 25 mg by mouth every morning.     methocarbamol 500 MG tablet  Commonly known as:  ROBAXIN  Take 1 tablet (500 mg total) by mouth every 6 (six) hours as needed.     omeprazole 20 MG capsule  Commonly known as:  PRILOSEC  Take 20 mg by mouth daily.     oxyCODONE 5 MG immediate release tablet  Commonly known as:  Oxy IR/ROXICODONE  Take 1-2 tablets (5-10 mg total) by mouth every 3 (three) hours as needed.     oxymetazoline 0.05 % nasal spray  Commonly known as:  AFRIN  Place 1 spray into the nose 2 (two) times daily.     rivaroxaban 10 MG Tabs tablet  Commonly known as:  XARELTO  Take 1 tablet (10 mg total) by mouth daily with breakfast. Take Xarelto for two and a half more weeks, then discontinue Xarelto.     traMADol 50 MG tablet  Commonly known as:  ULTRAM  Take 1-2 tablets (50-100 mg total) by mouth every 6 (six) hours as needed (mild pain).           Follow-up Information   Follow up with Loanne Drilling, MD. Schedule an appointment as soon as possible for a visit  on 11/23/2012.   Contact information:   [address] [place] [address] [phone]       Signed: Patrica Duel 11/10/2012, 7:49 AM

## 2012-11-10 NOTE — Progress Notes (Addendum)
Physical Therapy Treatment Patient Details Name: Jeffrey Goodman MRN: [id number] DOB: 10-09-1950 Today's Date: 11/10/2012 Time: 7829-5621 PT Time Calculation (min): 25 min  PT Assessment / Plan / Recommendation Comments on Treatment Session  instruction in  steps completed. will see after lunch for ther ex. then DC.    Follow Up Recommendations  Home health PT     Does the patient have the potential to tolerate intense rehabilitation     Barriers to Discharge        Equipment Recommendations  Rolling walker with 5" wheels    Recommendations for Other Services    Frequency 7X/week   Plan Discharge plan remains appropriate    Precautions / Restrictions Precautions Precautions: Knee Required Braces or Orthoses: Knee Immobilizer - Left Knee Immobilizer - Left: Discontinue once straight leg raise with < 10 degree lag Restrictions Weight Bearing Restrictions: No Other Position/Activity Restrictions: WBAT   Pertinent Vitals/Pain 8/10  L knee. Has had meds, warm blanket to thigh.    Mobility   Sit to Stand: 5: Supervision;With upper extremity assist;From chair/3-in-1 Stand to Sit: To chair/3-in-1;With armrests;5: Supervision Details for Transfer Assistance: cues for hand placement and LE management when sitting/standing. Ambulation/Gait Ambulation/Gait Assistance: 4: Min guard Ambulation Distance (Feet): 150  Feet Assistive device: Rolling walker Ambulation/Gait Assistance Details: cues for sequence posture. Gait Pattern: Step-to pattern;Decreased stride length;Antalgic;Trunk flexed Stairs: Yes Stairs Assistance: 4: Min assist Stairs Assistance Details (indicate cue type and reason): cues on technique. at later time brother instructed in steps without pt. present. Stair Management Technique: No rails;Backwards;With walker Number of Stairs: 2    Exercises     PT Diagnosis:    PT Problem List:   PT Treatment Interventions:     PT Goals Acute Rehab PT Goals Pt will  go Sit to Stand: with supervision PT Goal: Sit to Stand - Progress: Progressing toward goal Pt will go Stand to Sit: with supervision PT Goal: Stand to Sit - Progress: Met Pt will Ambulate: 51 - 150 feet;with supervision;with least restrictive assistive device Pt will Go Up / Down Stairs: 1-2 stairs;with min assist;with rail(s);with least restrictive assistive device PT Goal: Up/Down Stairs - Progress: Met  Visit Information  Last PT Received On: 11/10/12 Assistance Needed: +1    Subjective Data  Subjective: I really put too much weight on my knee. It really hurts   Cognition  Cognition Overall Cognitive Status: Appears within functional limits for tasks assessed/performed Arousal/Alertness: Awake/alert Orientation Level: Appears intact for tasks assessed Behavior During Session: [facility] for tasks performed    Balance     End of Session PT - End of Session Equipment Utilized During Treatment: Left knee immobilizer Activity Tolerance: Patient tolerated treatment well Patient left: with call bell/phone within reach;with family/visitor present;in chair Nurse Communication: Mobility status   GP     Rada Hay 11/10/2012, 12:32 PM

## 2015-03-01 ENCOUNTER — Ambulatory Visit: Payer: Self-pay | Admitting: Orthopedic Surgery

## 2015-03-01 NOTE — Progress Notes (Signed)
Preoperative surgical orders have been place into the Epic hospital system for Jeffrey Goodman on 03/01/2015, 10:36 AM  by Mickel Crow for surgery on 03-19-2015.  Preop Total Knee orders including Experal, IV Tylenol, and IV Decadron as long as there are no contraindications to the above medications. Arlee Muslim, PA-C

## 2015-03-02 ENCOUNTER — Ambulatory Visit: Payer: Self-pay | Admitting: Orthopedic Surgery

## 2015-03-02 NOTE — H&P (Signed)
Jeffrey Goodman DOB: 02/25/1951 Divorced / Language: English / Race: White Male Date of Admission:  03/19/2015 CC:  Right Knee Pain History of Present Illness The patient is a 64 year old male who comes in for a preoperative History and Physical. The patient is scheduled for a right total knee arthroplasty to be performed by Dr. Dione Plover. Aluisio, MD at [facility] on 03/19/15. The patient is a 64 year old male who presents for follow up of their knee. The patient is being followed for their right knee pain and osteoarthritis. They are several months month(s) out from cortisone injection by Molli Barrows PA-C. Symptoms reported include: pain, aching and difficulty ambulating. and report their pain level to be mild to moderate. The following medication has been used for pain control: Hydrocodone. He said the right knee is getting progressively worse. It is hard for him to make it through a full day of work. The pain is progressively worse over time. He occasionally gets swelling. The knee feels, he wants to give out. He is ready to go ahead and get the knee replaced. Injections have not helped significantly. He has done well following the left knee replacement and now is ready to proceed with the right knee. They have been treated conservatively in the past for the above stated problem and despite conservative measures, they continue to have progressive pain and severe functional limitations and dysfunction. They have failed non-operative management including home exercise, medications, and injections. It is felt that they would benefit from undergoing total joint replacement. Risks and benefits of the procedure have been discussed with the patient and they elect to proceed with surgery. There are no active contraindications to surgery such as ongoing infection or rapidly progressive neurological disease.  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 mother and father Osteoporosis mother Father Deceased, Dementia. age 30  Social History Alcohol use current drinker; drinks wine; 5-7 per week Children 2 Current work status working full time Drug/Alcohol Rehab (Currently) no Drug/Alcohol Rehab (Previously) no Exercise Exercises weekly; does gym / weights Illicit drug use no Marital status divorced Number of flights of stairs before winded less than 1 Pain Contract no Tobacco / smoke exposure yes outdoors only Tobacco use Never smoker. never smoker Post-Surgical Plans Plan is to go home. Current occupation Sports coach  Medication History Metoprolol Tartrate (25MG  Tablet, Oral) Active. Naproxen Sodium (220MG  Tablet, Oral) Active. Diltiazem HCl ER Beads (240MG  Capsule ER 24HR, Oral two times daily) Active. GuanFACINE HCl (2MG  Tablet, Oral) Active. Hydrochlorothiazide (25MG  Tablet, Oral) Active. Medications Reconciled  Past Surgical History  Lithotripsy Twice Total Knee Replacement - Left  Review of Systems General Not Present- Chills, Fatigue, Fever, Memory Loss, Night Sweats, Weight Gain and Weight Loss. Skin Not Present- Eczema, Hives, Itching, Lesions and Rash. HEENT Not Present- Dentures, Double Vision, Headache, Hearing Loss, Tinnitus and Visual Loss. Respiratory Not Present- Allergies, Chronic Cough, Coughing up blood, Shortness of breath at rest and Shortness of breath with exertion. Cardiovascular Not Present- Chest Pain, Difficulty Breathing Lying Down, Murmur, Palpitations,  Racing/skipping heartbeats and Swelling. Gastrointestinal Not Present- Abdominal Pain, Bloody Stool, Constipation, Diarrhea, Difficulty Swallowing, Heartburn, Jaundice, Loss  of appetitie, Nausea and Vomiting. Male Genitourinary Not Present- Blood in Urine, Discharge, Flank Pain, Incontinence, Painful Urination, Urgency, Urinary frequency, Urinary Retention, Urinating at Night and Weak urinary stream. Musculoskeletal Present- Joint Pain and Joint Swelling. Not Present- Back Pain, Morning Stiffness, Muscle Pain, Muscle Weakness and Spasms. Neurological Not Present- Blackout spells, Difficulty with balance, Dizziness, Paralysis, Tremor and Weakness. Psychiatric Not Present- Insomnia.  Vitals Weight: 238 lb Height: 71in Weight was reported by patient. Height was reported by patient. Body Surface Area: 2.27 m Body Mass Index: 33.19 kg/m  BP: 140/78 (Sitting, Right Arm, Standard)  Physical Exam (Alezandrew L. Alysha Doolan III PA-C; 03/02/2015 10:39 AM) General Mental Status -Alert, cooperative and good historian. General Appearance-pleasant, Not in acute distress. Orientation-Oriented X3. Build & Nutrition-Well nourished and Well developed.  Head and Neck Head-normocephalic, atraumatic . Neck Global Assessment - supple, no bruit auscultated on the right, no bruit auscultated on the left.  Eye Vision-Wears corrective lenses. Pupil - Bilateral-Regular and Round. Motion - Bilateral-EOMI.  Chest and Lung Exam Auscultation Breath sounds - clear at anterior chest wall and clear at posterior chest wall. Adventitious sounds - No Adventitious sounds.  Cardiovascular Auscultation Rhythm - Regular rate and rhythm. Heart Sounds - S1 WNL and S2 WNL. Murmurs & Other Heart Sounds - Auscultation of the heart reveals - No Murmurs.  Abdomen Palpation/Percussion Tenderness - Abdomen is non-tender to palpation. Rigidity (guarding) - Abdomen is soft. Auscultation Auscultation of  the abdomen reveals - Bowel sounds normal.  Male Genitourinary Note: Not done, not pertinent to present illness  Musculoskeletal Note: Well-developed male, in no distress. His right knee shows no effusion. His range is about 5 to 125 with marked crepitus on range of motion, tenderness medial greater than lateral, no instability noted.  RADIOGRAPHS Most recent x-rays from a few months ago and he is bone-on-bone in the medial and patellofemoral compartments with significant varus deformity of the knee.  Assessment & Plan Primary osteoarthritis of one knee, right (M17.11) Note:Surgical Plans: Right Total Knee Replacement  Disposition: Home  PCP: Dr. Marlyn Corporal  IV TXA  Anesthesia Issues: None  Signed electronically by Ok Edwards, III PA-C

## 2015-03-09 NOTE — Patient Instructions (Addendum)
YOUR PROCEDURE IS SCHEDULED ON : 03/19/15  REPORT TO [facility] MAIN ENTRANCE FOLLOW SIGNS TO EAST ELEVATOR - GO TO 3rd FLOOR CHECK IN AT 3 EAST NURSES STATION (SHORT STAY) AT:  8:30 AM  CALL THIS NUMBER IF YOU HAVE PROBLEMS THE MORNING OF SURGERY [phone]  REMEMBER:ONLY 1 PER PERSON MAY GO TO SHORT STAY WITH YOU TO GET READY THE MORNING OF YOUR SURGERY  DO NOT EAT FOOD OR DRINK LIQUIDS AFTER MIDNIGHT  TAKE THESE MEDICINES THE MORNING OF SURGERY: DILTIAZEM / METOPROLOL / TENEX / MAY USE HYDROCODONE IF NEEDED  YOU MAY NOT HAVE ANY METAL ON YOUR BODY INCLUDING HAIR PINS AND PIERCING'S. DO NOT WEAR JEWELRY, MAKEUP, LOTIONS, POWDERS OR PERFUMES. DO NOT WEAR NAIL POLISH. DO NOT SHAVE 48 HRS PRIOR TO SURGERY. MEN MAY SHAVE FACE AND NECK.  DO NOT Cos Cob. [facility] IS NOT RESPONSIBLE FOR VALUABLES.  CONTACTS, DENTURES OR PARTIALS MAY NOT BE WORN TO SURGERY. LEAVE SUITCASE IN CAR. CAN BE BROUGHT TO ROOM AFTER SURGERY.  PATIENTS DISCHARGED THE DAY OF SURGERY WILL NOT BE ALLOWED TO DRIVE HOME.  PLEASE READ OVER THE FOLLOWING INSTRUCTION SHEETS _________________________________________________________________________________                                          Jeffrey Goodman - PREPARING FOR SURGERY  Before surgery, you can play an important role.  Because skin is not sterile, your skin needs to be as free of germs as possible.  You can reduce the number of germs on your skin by washing with CHG (chlorahexidine gluconate) soap before surgery.  CHG is an antiseptic cleaner which kills germs and bonds with the skin to continue killing germs even after washing. Please DO NOT use if you have an allergy to CHG or antibacterial soaps.  If your skin becomes reddened/irritated stop using the CHG and inform your nurse when you arrive at Short Stay. Do not shave (including legs and underarms) for at least 48 hours prior to the first CHG shower.  You may shave  your face. Please follow these instructions carefully:   1.  Shower with CHG Soap the night before surgery and the  morning of Surgery.   2.  If you choose to wash your hair, wash your hair first as usual with your  normal  Shampoo.   3.  After you shampoo, rinse your hair and body thoroughly to remove the  shampoo.                                         4.  Use CHG as you would any other liquid soap.  You can apply chg directly  to the skin and wash . Gently wash with scrungie or clean wascloth    5.  Apply the CHG Soap to your body ONLY FROM THE NECK DOWN.   Do not use on open                           Wound or open sores. Avoid contact with eyes, ears mouth and genitals (private parts).  Genitals (private parts) with your normal soap.              6.  Wash thoroughly, paying special attention to the area where your surgery  will be performed.   7.  Thoroughly rinse your body with warm water from the neck down.   8.  DO NOT shower/wash with your normal soap after using and rinsing off  the CHG Soap .                9.  Pat yourself dry with a clean towel.             10.  Wear clean night clothes to bed after shower             11.  Place clean sheets on your bed the night of your first shower and do not  sleep with pets.  Day of Surgery : Do not apply any lotions/deodorants the morning of surgery.  Please wear clean clothes to the hospital/surgery center.  FAILURE TO FOLLOW THESE INSTRUCTIONS MAY RESULT IN THE CANCELLATION OF YOUR SURGERY    PATIENT SIGNATURE_________________________________  ______________________________________________________________________     Jeffrey Goodman  An incentive spirometer is a tool that can help keep your lungs clear and active. This tool measures how well you are filling your lungs with each breath. Taking long deep breaths may help reverse or decrease the chance of developing breathing (pulmonary) problems  (especially infection) following:  A long period of time when you are unable to move or be active. BEFORE THE PROCEDURE   If the spirometer includes an indicator to show your best effort, your nurse or respiratory therapist will set it to a desired goal.  If possible, sit up straight or lean slightly forward. Try not to slouch.  Hold the incentive spirometer in an upright position. INSTRUCTIONS FOR USE   Sit on the edge of your bed if possible, or sit up as far as you can in bed or on a chair.  Hold the incentive spirometer in an upright position.  Breathe out normally.  Place the mouthpiece in your mouth and seal your lips tightly around it.  Breathe in slowly and as deeply as possible, raising the piston or the ball toward the top of the column.  Hold your breath for 3-5 seconds or for as long as possible. Allow the piston or ball to fall to the bottom of the column.  Remove the mouthpiece from your mouth and breathe out normally.  Rest for a few seconds and repeat Steps 1 through 7 at least 10 times every 1-2 hours when you are awake. Take your time and take a few normal breaths between deep breaths.  The spirometer may include an indicator to show your best effort. Use the indicator as a goal to work toward during each repetition.  After each set of 10 deep breaths, practice coughing to be sure your lungs are clear. If you have an incision (the cut made at the time of surgery), support your incision when coughing by placing a pillow or rolled up towels firmly against it. Once you are able to get out of bed, walk around indoors and cough well. You may stop using the incentive spirometer when instructed by your caregiver.  RISKS AND COMPLICATIONS  Take your time so you do not get dizzy or light-headed.  If you are in pain, you may need to take or ask for pain medication before doing incentive spirometry. It is  harder to take a deep breath if you are having pain. AFTER  USE  Rest and breathe slowly and easily.  It can be helpful to keep track of a log of your progress. Your caregiver can provide you with a simple table to help with this. If you are using the spirometer at home, follow these instructions: [place] IF:   You are having difficultly using the spirometer.  You have trouble using the spirometer as often as instructed.  Your pain medication is not giving enough relief while using the spirometer.  You develop fever of 100.5 F (38.1 C) or higher. SEEK IMMEDIATE MEDICAL CARE IF:   You cough up bloody sputum that had not been present before.  You develop fever of 102 F (38.9 C) or greater.  You develop worsening pain at or near the incision site. MAKE SURE YOU:   Understand these instructions.  Will watch your condition.  Will get help right away if you are not doing well or get worse. Document Released: 01/05/2007 Document Revised: 11/17/2011 Document Reviewed: 03/08/2007 Exit[employer] Patient Information 2014 Exit[employer], [place].   ________________________________________________________________________  WHAT IS A BLOOD TRANSFUSION? Blood Transfusion Information  A transfusion is the replacement of blood or some of its parts. Blood is made up of multiple cells which provide different functions.  Red blood cells carry oxygen and are used for blood loss replacement.  White blood cells fight against infection.  Platelets control bleeding.  Plasma helps clot blood.  Other blood products are available for specialized needs, such as hemophilia or other clotting disorders. BEFORE THE TRANSFUSION  Who gives blood for transfusions?   Healthy volunteers who are fully evaluated to make sure their blood is safe. This is blood bank blood. Transfusion therapy is the safest it has ever been in the practice of medicine. Before blood is taken from a donor, a complete history is taken to make sure that person has no history of diseases  nor engages in risky social behavior (examples are intravenous drug use or sexual activity with multiple partners). The donor's travel history is screened to minimize risk of transmitting infections, such as malaria. The donated blood is tested for signs of infectious diseases, such as HIV and hepatitis. The blood is then tested to be sure it is compatible with you in order to minimize the chance of a transfusion reaction. If you or a relative donates blood, this is often done in anticipation of surgery and is not appropriate for emergency situations. It takes many days to process the donated blood. RISKS AND COMPLICATIONS Although transfusion therapy is very safe and saves many lives, the main dangers of transfusion include:   Getting an infectious disease.  Developing a transfusion reaction. This is an allergic reaction to something in the blood you were given. Every precaution is taken to prevent this. The decision to have a blood transfusion has been considered carefully by your caregiver before blood is given. Blood is not given unless the benefits outweigh the risks. AFTER THE TRANSFUSION  Right after receiving a blood transfusion, you will usually feel much better and more energetic. This is especially true if your red blood cells have gotten low (anemic). The transfusion raises the level of the red blood cells which carry oxygen, and this usually causes an energy increase.  The nurse administering the transfusion will monitor you carefully for complications. HOME CARE INSTRUCTIONS  No special instructions are needed after a transfusion. You may find your energy is better. Speak  with your caregiver about any limitations on activity for underlying diseases you may have. SEEK MEDICAL CARE IF:   Your condition is not improving after your transfusion.  You develop redness or irritation at the intravenous (IV) site. SEEK IMMEDIATE MEDICAL CARE IF:  Any of the following symptoms occur over the  next 12 hours:  Shaking chills.  You have a temperature by mouth above 102 F (38.9 C), not controlled by medicine.  Chest, back, or muscle pain.  People around you feel you are not acting correctly or are confused.  Shortness of breath or difficulty breathing.  Dizziness and fainting.  You get a rash or develop hives.  You have a decrease in urine output.  Your urine turns a dark color or changes to pink, red, or brown. Any of the following symptoms occur over the next 10 days:  You have a temperature by mouth above 102 F (38.9 C), not controlled by medicine.  Shortness of breath.  Weakness after normal activity.  The white part of the eye turns yellow (jaundice).  You have a decrease in the amount of urine or are urinating less often.  Your urine turns a dark color or changes to pink, red, or brown. Document Released: 08/22/2000 Document Revised: 11/17/2011 Document Reviewed: 04/10/2008 [facility] Patient Information 2014 [place], [place].  _______________________________________________________________________

## 2015-03-13 NOTE — Progress Notes (Signed)
Surgery clearance note Dr. Tollie Pizza 12/05/14 on chart

## 2015-03-14 ENCOUNTER — Encounter (HOSPITAL_COMMUNITY): Payer: Self-pay

## 2015-03-14 ENCOUNTER — Encounter (HOSPITAL_COMMUNITY)
Admission: RE | Admit: 2015-03-14 | Discharge: 2015-03-14 | Disposition: A | Discharge status: Home / Self Care | Payer: BLUE CROSS/BLUE SHIELD | Source: Ambulatory Visit | Attending: Orthopedic Surgery | Admitting: Orthopedic Surgery

## 2015-03-14 DIAGNOSIS — Z01812 Encounter for preprocedural laboratory examination: Secondary | ICD-10-CM | POA: Diagnosis not present

## 2015-03-14 DIAGNOSIS — Z01810 Encounter for preprocedural cardiovascular examination: Secondary | ICD-10-CM | POA: Diagnosis not present

## 2015-03-14 DIAGNOSIS — M1711 Unilateral primary osteoarthritis, right knee: Secondary | ICD-10-CM | POA: Diagnosis not present

## 2015-03-14 DIAGNOSIS — I10 Essential (primary) hypertension: Secondary | ICD-10-CM | POA: Insufficient documentation

## 2015-03-14 HISTORY — DX: Unspecified hemorrhoids: K64.9

## 2015-03-14 LAB — COMPREHENSIVE METABOLIC PANEL
ALT: 34 U/L (ref 17–63)
AST: 30 U/L (ref 15–41)
Albumin: 4.3 g/dL (ref 3.5–5.0)
Alkaline Phosphatase: 84 U/L (ref 38–126)
Anion gap: 7 (ref 5–15)
BUN: 21 mg/dL — AB (ref 6–20)
CALCIUM: 9.6 mg/dL (ref 8.9–10.3)
CO2: 30 mmol/L (ref 22–32)
Chloride: 101 mmol/L (ref 101–111)
Creatinine, Ser: 1.09 mg/dL (ref 0.61–1.24)
GFR calc Af Amer: >60 mL/min (ref >60)
GFR calc non Af Amer: >60 mL/min (ref >60)
Glucose, Bld: 119 mg/dL — ABNORMAL HIGH (ref 65–99)
Potassium: 4.4 mmol/L (ref 3.5–5.1)
SODIUM: 138 mmol/L (ref 135–145)
TOTAL PROTEIN: 7.8 g/dL (ref 6.5–8.1)
Total Bilirubin: 0.9 mg/dL (ref 0.3–1.2)

## 2015-03-14 LAB — SURGICAL PCR SCREEN
MRSA, PCR: NEGATIVE
Staphylococcus aureus: NEGATIVE

## 2015-03-14 LAB — URINALYSIS, ROUTINE W REFLEX MICROSCOPIC
Glucose, UA: NEGATIVE mg/dL
HGB URINE DIPSTICK: NEGATIVE
Ketones, ur: NEGATIVE mg/dL
Leukocytes, UA: NEGATIVE
Nitrite: NEGATIVE
PH: 6.5 (ref 5.0–8.0)
Protein, ur: NEGATIVE mg/dL
SPECIFIC GRAVITY, URINE: 1.026 (ref 1.005–1.030)
Urobilinogen, UA: 1 mg/dL (ref 0.0–1.0)

## 2015-03-14 LAB — PROTIME-INR
INR: 1.13 (ref 0.00–1.49)
PROTHROMBIN TIME: 14.7 s (ref 11.6–15.2)

## 2015-03-14 LAB — CBC
HEMATOCRIT: 48.7 % (ref 39.0–52.0)
Hemoglobin: 16.5 g/dL (ref 13.0–17.0)
MCH: 32.7 pg (ref 26.0–34.0)
MCHC: 33.9 g/dL (ref 30.0–36.0)
MCV: 96.6 fL (ref 78.0–100.0)
Platelets: 222 10*3/uL (ref 150–400)
RBC: 5.04 MIL/uL (ref 4.22–5.81)
RDW: 12.6 % (ref 11.5–15.5)
WBC: 7.6 10*3/uL (ref 4.0–10.5)

## 2015-03-14 LAB — APTT: aPTT: 32 seconds (ref 24–37)

## 2015-03-14 NOTE — Progress Notes (Signed)
   03/14/15 0905  OBSTRUCTIVE SLEEP APNEA  Have you ever been diagnosed with sleep apnea through a sleep study? No  Do you snore loudly (loud enough to be heard through closed doors)?  0  Do you often feel tired, fatigued, or sleepy during the daytime? 0  Has anyone observed you stop breathing during your sleep? 0  Do you have, or are you being treated for high blood pressure? 1  BMI more than 35 kg/m2? 0  Age over 62 years old? 1  Neck circumference greater than 40 cm/16 inches? 1  Gender: 1

## 2015-03-19 ENCOUNTER — Inpatient Hospital Stay (HOSPITAL_COMMUNITY)
Admission: RE | Admit: 2015-03-19 | Discharge: 2015-03-21 | DRG: 470 | Disposition: A | Discharge status: Home Health | Payer: BLUE CROSS/BLUE SHIELD | Source: Ambulatory Visit | Attending: Orthopedic Surgery | Admitting: Orthopedic Surgery

## 2015-03-19 ENCOUNTER — Inpatient Hospital Stay (HOSPITAL_COMMUNITY): Payer: BLUE CROSS/BLUE SHIELD | Admitting: Registered Nurse

## 2015-03-19 ENCOUNTER — Encounter (HOSPITAL_COMMUNITY): Payer: Self-pay | Admitting: *Deleted

## 2015-03-19 ENCOUNTER — Encounter (HOSPITAL_COMMUNITY)
Admission: RE | Disposition: A | Discharge status: Home Health | Payer: Self-pay | Source: Ambulatory Visit | Attending: Orthopedic Surgery

## 2015-03-19 DIAGNOSIS — Z01812 Encounter for preprocedural laboratory examination: Secondary | ICD-10-CM | POA: Diagnosis not present

## 2015-03-19 DIAGNOSIS — Z96652 Presence of left artificial knee joint: Secondary | ICD-10-CM | POA: Diagnosis present

## 2015-03-19 DIAGNOSIS — M25561 Pain in right knee: Secondary | ICD-10-CM | POA: Diagnosis present

## 2015-03-19 DIAGNOSIS — K219 Gastro-esophageal reflux disease without esophagitis: Secondary | ICD-10-CM | POA: Diagnosis present

## 2015-03-19 DIAGNOSIS — I10 Essential (primary) hypertension: Secondary | ICD-10-CM | POA: Diagnosis present

## 2015-03-19 DIAGNOSIS — Z87442 Personal history of urinary calculi: Secondary | ICD-10-CM | POA: Diagnosis not present

## 2015-03-19 DIAGNOSIS — Z79899 Other long term (current) drug therapy: Secondary | ICD-10-CM | POA: Diagnosis not present

## 2015-03-19 DIAGNOSIS — M1711 Unilateral primary osteoarthritis, right knee: Secondary | ICD-10-CM

## 2015-03-19 DIAGNOSIS — M179 Osteoarthritis of knee, unspecified: Secondary | ICD-10-CM | POA: Diagnosis present

## 2015-03-19 DIAGNOSIS — M1710 Unilateral primary osteoarthritis, unspecified knee: Secondary | ICD-10-CM | POA: Diagnosis present

## 2015-03-19 HISTORY — PX: TOTAL KNEE ARTHROPLASTY: SHX125

## 2015-03-19 LAB — TYPE AND SCREEN
ABO/RH(D): O POS
Antibody Screen: NEGATIVE

## 2015-03-19 SURGERY — ARTHROPLASTY, KNEE, TOTAL
Anesthesia: Spinal | Site: Knee | Laterality: Right

## 2015-03-19 MED ORDER — ACETAMINOPHEN 325 MG PO TABS: 650.0000 mg | ORAL_TABLET | Freq: Four times a day (QID) | ORAL | Status: DC | PRN

## 2015-03-19 MED ORDER — MORPHINE SULFATE 2 MG/ML IJ SOLN
1.0000 mg | INTRAMUSCULAR | Status: DC | PRN
  Administered 2015-03-20: 2 mg via INTRAVENOUS
  Filled 2015-03-19: qty 1

## 2015-03-19 MED ORDER — MIDAZOLAM HCL 5 MG/5ML IJ SOLN
INTRAMUSCULAR | Status: DC | PRN
  Administered 2015-03-19: 2 mg via INTRAVENOUS

## 2015-03-19 MED ORDER — DEXAMETHASONE SODIUM PHOSPHATE 10 MG/ML IJ SOLN
10.0000 mg | Freq: Once | INTRAMUSCULAR | Status: AC
  Administered 2015-03-19: 10 mg via INTRAVENOUS

## 2015-03-19 MED ORDER — DIPHENHYDRAMINE HCL 12.5 MG/5ML PO ELIX
12.5000 mg | ORAL_SOLUTION | ORAL | Status: DC | PRN
  Administered 2015-03-19 – 2015-03-20 (×2): 25 mg via ORAL
  Filled 2015-03-19 (×2): qty 10

## 2015-03-19 MED ORDER — SODIUM CHLORIDE 0.9 % IV SOLN
1000.0000 mg | INTRAVENOUS | Status: AC
  Administered 2015-03-19: 1000 mg via INTRAVENOUS
  Filled 2015-03-19: qty 10

## 2015-03-19 MED ORDER — CEFAZOLIN SODIUM-DEXTROSE 2-3 GM-% IV SOLR
INTRAVENOUS | Status: AC
  Filled 2015-03-19: qty 50

## 2015-03-19 MED ORDER — ACETAMINOPHEN 10 MG/ML IV SOLN
INTRAVENOUS | Status: AC
  Filled 2015-03-19: qty 100

## 2015-03-19 MED ORDER — CHLORHEXIDINE GLUCONATE 4 % EX LIQD: 60.0000 mL | Freq: Once | CUTANEOUS | Status: DC

## 2015-03-19 MED ORDER — HYDROCHLOROTHIAZIDE 25 MG PO TABS
25.0000 mg | ORAL_TABLET | Freq: Every day | ORAL | Status: DC
  Administered 2015-03-20 – 2015-03-21 (×2): 25 mg via ORAL
  Filled 2015-03-19 (×2): qty 1

## 2015-03-19 MED ORDER — BUPIVACAINE LIPOSOME 1.3 % IJ SUSP
INTRAMUSCULAR | Status: DC | PRN
  Administered 2015-03-19: 20 mL

## 2015-03-19 MED ORDER — BISACODYL 10 MG RE SUPP: 10.0000 mg | Freq: Every day | RECTAL | Status: DC | PRN

## 2015-03-19 MED ORDER — LACTATED RINGERS IV SOLN
INTRAVENOUS | Status: DC
  Administered 2015-03-19: 12:00:00 via INTRAVENOUS
  Administered 2015-03-19: 1000 mL via INTRAVENOUS
  Administered 2015-03-19: 12:00:00 via INTRAVENOUS

## 2015-03-19 MED ORDER — TRAMADOL HCL 50 MG PO TABS
50.0000 mg | ORAL_TABLET | Freq: Four times a day (QID) | ORAL | Status: DC | PRN
  Administered 2015-03-19: 100 mg via ORAL
  Filled 2015-03-19: qty 2

## 2015-03-19 MED ORDER — GLYCOPYRROLATE 0.2 MG/ML IJ SOLN
INTRAMUSCULAR | Status: DC | PRN
  Administered 2015-03-19: 0.2 mg via INTRAVENOUS

## 2015-03-19 MED ORDER — PROPOFOL 10 MG/ML IV BOLUS
INTRAVENOUS | Status: DC | PRN
  Administered 2015-03-19: 30 mg via INTRAVENOUS

## 2015-03-19 MED ORDER — HYDROMORPHONE HCL 1 MG/ML IJ SOLN
INTRAMUSCULAR | Status: DC | PRN
  Administered 2015-03-19 (×2): 1 mg via INTRAVENOUS

## 2015-03-19 MED ORDER — DOCUSATE SODIUM 100 MG PO CAPS
100.0000 mg | ORAL_CAPSULE | Freq: Two times a day (BID) | ORAL | Status: DC
  Administered 2015-03-19 – 2015-03-21 (×4): 100 mg via ORAL

## 2015-03-19 MED ORDER — PROPOFOL INFUSION 10 MG/ML OPTIME
INTRAVENOUS | Status: DC | PRN
  Administered 2015-03-19: 140 ug/kg/min via INTRAVENOUS

## 2015-03-19 MED ORDER — ONDANSETRON HCL 4 MG PO TABS: 4.0000 mg | ORAL_TABLET | Freq: Four times a day (QID) | ORAL | Status: DC | PRN

## 2015-03-19 MED ORDER — ACETAMINOPHEN 500 MG PO TABS
1000.0000 mg | ORAL_TABLET | Freq: Four times a day (QID) | ORAL | Status: AC
  Administered 2015-03-19 – 2015-03-20 (×4): 1000 mg via ORAL
  Filled 2015-03-19 (×5): qty 2

## 2015-03-19 MED ORDER — FENTANYL CITRATE (PF) 100 MCG/2ML IJ SOLN
INTRAMUSCULAR | Status: AC
  Filled 2015-03-19: qty 2

## 2015-03-19 MED ORDER — PHENOL 1.4 % MT LIQD: 1.0000 | OROMUCOSAL | Status: DC | PRN

## 2015-03-19 MED ORDER — SODIUM CHLORIDE 0.9 % IV SOLN
INTRAVENOUS | Status: DC
  Administered 2015-03-19: 18:00:00 via INTRAVENOUS

## 2015-03-19 MED ORDER — LACTATED RINGERS IV SOLN: INTRAVENOUS | Status: DC

## 2015-03-19 MED ORDER — METOCLOPRAMIDE HCL 10 MG PO TABS: 5.0000 mg | ORAL_TABLET | Freq: Three times a day (TID) | ORAL | Status: DC | PRN

## 2015-03-19 MED ORDER — OXYMETAZOLINE HCL 0.05 % NA SOLN
NASAL | Status: DC | PRN
  Administered 2015-03-19: 1 via NASAL

## 2015-03-19 MED ORDER — SODIUM CHLORIDE 0.9 % IR SOLN
Status: DC | PRN
  Administered 2015-03-19: 1000 mL

## 2015-03-19 MED ORDER — FENTANYL CITRATE (PF) 100 MCG/2ML IJ SOLN
INTRAMUSCULAR | Status: DC | PRN
  Administered 2015-03-19: 100 ug via INTRAVENOUS
  Administered 2015-03-19 (×2): 50 ug via INTRAVENOUS
  Administered 2015-03-19: 100 ug via INTRAVENOUS

## 2015-03-19 MED ORDER — DILTIAZEM HCL ER COATED BEADS 240 MG PO CP24
480.0000 mg | ORAL_CAPSULE | Freq: Every day | ORAL | Status: DC
  Administered 2015-03-20 – 2015-03-21 (×2): 480 mg via ORAL
  Filled 2015-03-19 (×2): qty 2

## 2015-03-19 MED ORDER — BUPIVACAINE IN DEXTROSE 0.75-8.25 % IT SOLN
INTRATHECAL | Status: DC | PRN
  Administered 2015-03-19: 2 mL via INTRATHECAL

## 2015-03-19 MED ORDER — PROPOFOL 10 MG/ML IV BOLUS
INTRAVENOUS | Status: AC
  Filled 2015-03-19: qty 20

## 2015-03-19 MED ORDER — DEXAMETHASONE SODIUM PHOSPHATE 10 MG/ML IJ SOLN
10.0000 mg | Freq: Once | INTRAMUSCULAR | Status: AC
  Administered 2015-03-20: 10 mg via INTRAVENOUS
  Filled 2015-03-19: qty 1

## 2015-03-19 MED ORDER — SODIUM CHLORIDE 0.9 % IJ SOLN
INTRAMUSCULAR | Status: DC | PRN
  Administered 2015-03-19: 30 mL

## 2015-03-19 MED ORDER — PROMETHAZINE HCL 25 MG/ML IJ SOLN: 6.2500 mg | INTRAMUSCULAR | Status: DC | PRN

## 2015-03-19 MED ORDER — POLYETHYLENE GLYCOL 3350 17 G PO PACK: 17.0000 g | PACK | Freq: Every day | ORAL | Status: DC | PRN

## 2015-03-19 MED ORDER — MIDAZOLAM HCL 2 MG/2ML IJ SOLN
INTRAMUSCULAR | Status: AC
  Filled 2015-03-19: qty 2

## 2015-03-19 MED ORDER — SODIUM CHLORIDE 0.9 % IV SOLN: INTRAVENOUS | Status: DC

## 2015-03-19 MED ORDER — KETOROLAC TROMETHAMINE 15 MG/ML IJ SOLN
7.5000 mg | Freq: Four times a day (QID) | INTRAMUSCULAR | Status: AC | PRN
  Administered 2015-03-19: 7.5 mg via INTRAVENOUS
  Filled 2015-03-19: qty 1

## 2015-03-19 MED ORDER — BUPIVACAINE HCL 0.25 % IJ SOLN
INTRAMUSCULAR | Status: DC | PRN
Start: 2015-03-19 — End: 2015-03-19
  Administered 2015-03-19: 30 mL

## 2015-03-19 MED ORDER — GUANFACINE HCL 2 MG PO TABS
2.0000 mg | ORAL_TABLET | Freq: Every morning | ORAL | Status: DC
  Administered 2015-03-20 – 2015-03-21 (×2): 2 mg via ORAL
  Filled 2015-03-19 (×2): qty 1

## 2015-03-19 MED ORDER — CEFAZOLIN SODIUM-DEXTROSE 2-3 GM-% IV SOLR
2.0000 g | INTRAVENOUS | Status: AC
  Administered 2015-03-19: 2 g via INTRAVENOUS

## 2015-03-19 MED ORDER — SODIUM CHLORIDE 0.9 % IJ SOLN
INTRAMUSCULAR | Status: AC
  Filled 2015-03-19: qty 50

## 2015-03-19 MED ORDER — 0.9 % SODIUM CHLORIDE (POUR BTL) OPTIME
TOPICAL | Status: DC | PRN
  Administered 2015-03-19: 1000 mL

## 2015-03-19 MED ORDER — METHOCARBAMOL 500 MG PO TABS
500.0000 mg | ORAL_TABLET | Freq: Four times a day (QID) | ORAL | Status: DC | PRN
  Administered 2015-03-19 – 2015-03-20 (×4): 500 mg via ORAL
  Filled 2015-03-19 (×4): qty 1

## 2015-03-19 MED ORDER — LACTATED RINGERS IV SOLN: INTRAVENOUS | Status: DC | PRN

## 2015-03-19 MED ORDER — RIVAROXABAN 10 MG PO TABS
10.0000 mg | ORAL_TABLET | Freq: Every day | ORAL | Status: DC
  Administered 2015-03-20 – 2015-03-21 (×2): 10 mg via ORAL
  Filled 2015-03-19 (×3): qty 1

## 2015-03-19 MED ORDER — FENTANYL CITRATE (PF) 100 MCG/2ML IJ SOLN
25.0000 ug | INTRAMUSCULAR | Status: DC | PRN
  Administered 2015-03-19 (×2): 50 ug via INTRAVENOUS

## 2015-03-19 MED ORDER — ONDANSETRON HCL 4 MG/2ML IJ SOLN: 4.0000 mg | Freq: Four times a day (QID) | INTRAMUSCULAR | Status: DC | PRN

## 2015-03-19 MED ORDER — HYDROMORPHONE HCL 2 MG/ML IJ SOLN
INTRAMUSCULAR | Status: AC
  Filled 2015-03-19: qty 1

## 2015-03-19 MED ORDER — METOPROLOL TARTRATE 25 MG PO TABS
25.0000 mg | ORAL_TABLET | Freq: Two times a day (BID) | ORAL | Status: DC
  Administered 2015-03-20 – 2015-03-21 (×3): 25 mg via ORAL
  Filled 2015-03-19 (×4): qty 1

## 2015-03-19 MED ORDER — METHOCARBAMOL 1000 MG/10ML IJ SOLN
500.0000 mg | Freq: Four times a day (QID) | INTRAVENOUS | Status: DC | PRN
  Administered 2015-03-19: 500 mg via INTRAVENOUS
  Filled 2015-03-19 (×2): qty 5

## 2015-03-19 MED ORDER — MEPERIDINE HCL 50 MG/ML IJ SOLN: 6.2500 mg | INTRAMUSCULAR | Status: DC | PRN

## 2015-03-19 MED ORDER — FLEET ENEMA 7-19 GM/118ML RE ENEM: 1.0000 | ENEMA | Freq: Once | RECTAL | Status: AC | PRN

## 2015-03-19 MED ORDER — BUPIVACAINE LIPOSOME 1.3 % IJ SUSP
20.0000 mL | Freq: Once | INTRAMUSCULAR | Status: DC
  Filled 2015-03-19: qty 20

## 2015-03-19 MED ORDER — CEFAZOLIN SODIUM-DEXTROSE 2-3 GM-% IV SOLR
2.0000 g | Freq: Four times a day (QID) | INTRAVENOUS | Status: AC
  Administered 2015-03-19 – 2015-03-20 (×2): 2 g via INTRAVENOUS
  Filled 2015-03-19 (×3): qty 50

## 2015-03-19 MED ORDER — ACETAMINOPHEN 10 MG/ML IV SOLN
1000.0000 mg | Freq: Once | INTRAVENOUS | Status: AC
  Administered 2015-03-19: 1000 mg via INTRAVENOUS
  Filled 2015-03-19: qty 100

## 2015-03-19 MED ORDER — BUPIVACAINE HCL (PF) 0.25 % IJ SOLN
INTRAMUSCULAR | Status: AC
  Filled 2015-03-19: qty 30

## 2015-03-19 MED ORDER — PANTOPRAZOLE SODIUM 40 MG PO TBEC
40.0000 mg | DELAYED_RELEASE_TABLET | Freq: Every day | ORAL | Status: AC
  Administered 2015-03-19 – 2015-03-20 (×2): 40 mg via ORAL
  Filled 2015-03-19 (×2): qty 1

## 2015-03-19 MED ORDER — OXYCODONE HCL 5 MG PO TABS
5.0000 mg | ORAL_TABLET | ORAL | Status: DC | PRN
  Administered 2015-03-19 (×2): 5 mg via ORAL
  Administered 2015-03-19 – 2015-03-20 (×7): 10 mg via ORAL
  Filled 2015-03-19: qty 1
  Filled 2015-03-19 (×6): qty 2
  Filled 2015-03-19: qty 1
  Filled 2015-03-19: qty 2

## 2015-03-19 MED ORDER — OXYMETAZOLINE HCL 0.05 % NA SOLN
NASAL | Status: AC
  Filled 2015-03-19: qty 15

## 2015-03-19 MED ORDER — METOCLOPRAMIDE HCL 5 MG/ML IJ SOLN: 5.0000 mg | Freq: Three times a day (TID) | INTRAMUSCULAR | Status: DC | PRN

## 2015-03-19 MED ORDER — DEXAMETHASONE SODIUM PHOSPHATE 10 MG/ML IJ SOLN
INTRAMUSCULAR | Status: AC
  Filled 2015-03-19: qty 1

## 2015-03-19 MED ORDER — ONDANSETRON HCL 4 MG/2ML IJ SOLN
INTRAMUSCULAR | Status: AC
  Filled 2015-03-19: qty 2

## 2015-03-19 MED ORDER — ACETAMINOPHEN 650 MG RE SUPP: 650.0000 mg | Freq: Four times a day (QID) | RECTAL | Status: DC | PRN

## 2015-03-19 MED ORDER — MENTHOL 3 MG MT LOZG: 1.0000 | LOZENGE | OROMUCOSAL | Status: DC | PRN

## 2015-03-19 MED ORDER — ONDANSETRON HCL 4 MG/2ML IJ SOLN
INTRAMUSCULAR | Status: DC | PRN
  Administered 2015-03-19: 4 mg via INTRAVENOUS

## 2015-03-19 SURGICAL SUPPLY — 65 items
BAG DECANTER FOR FLEXI CONT (MISCELLANEOUS) ×2 IMPLANT
BAG SPEC THK2 15X12 ZIP CLS (MISCELLANEOUS) ×1
BAG ZIPLOCK 12X15 (MISCELLANEOUS) ×2 IMPLANT
BANDAGE ELASTIC 6 VELCRO ST LF (GAUZE/BANDAGES/DRESSINGS) ×2 IMPLANT
BANDAGE ESMARK 6X9 LF (GAUZE/BANDAGES/DRESSINGS) ×1 IMPLANT
BLADE SAG 18X100X1.27 (BLADE) ×2 IMPLANT
BLADE SAW SGTL 11.0X1.19X90.0M (BLADE) ×2 IMPLANT
BNDG CMPR 9X6 STRL LF SNTH (GAUZE/BANDAGES/DRESSINGS) ×1
BNDG ESMARK 6X9 LF (GAUZE/BANDAGES/DRESSINGS) ×2
BOWL SMART MIX CTS (DISPOSABLE) ×2 IMPLANT
CAPT KNEE TOTAL 3 ATTUNE ×1 IMPLANT
CEMENT HV SMART SET (Cement) ×4 IMPLANT
CUFF TOURN SGL QUICK 34 (TOURNIQUET CUFF) ×2
CUFF TRNQT CYL 34X4X40X1 (TOURNIQUET CUFF) ×1 IMPLANT
DECANTER SPIKE VIAL GLASS SM (MISCELLANEOUS) ×2 IMPLANT
DRAPE EXTREMITY T 121X128X90 (DRAPE) ×2 IMPLANT
DRAPE POUCH INSTRU U-SHP 10X18 (DRAPES) ×2 IMPLANT
DRAPE U-SHAPE 47X51 STRL (DRAPES) ×2 IMPLANT
DRSG ADAPTIC 3X8 NADH LF (GAUZE/BANDAGES/DRESSINGS) ×2 IMPLANT
DRSG PAD ABDOMINAL 8X10 ST (GAUZE/BANDAGES/DRESSINGS) ×2 IMPLANT
DURAPREP 26ML APPLICATOR (WOUND CARE) ×2 IMPLANT
ELECT REM PT RETURN 9FT ADLT (ELECTROSURGICAL) ×2
ELECTRODE REM PT RTRN 9FT ADLT (ELECTROSURGICAL) ×1 IMPLANT
EVACUATOR 1/8 PVC DRAIN (DRAIN) ×2 IMPLANT
FACESHIELD WRAPAROUND (MASK) ×10 IMPLANT
FACESHIELD WRAPAROUND OR TEAM (MASK) ×5 IMPLANT
GAUZE SPONGE 4X4 12PLY STRL (GAUZE/BANDAGES/DRESSINGS) ×2 IMPLANT
GLOVE BIO SURGEON STRL SZ7.5 (GLOVE) IMPLANT
GLOVE BIO SURGEON STRL SZ8 (GLOVE) ×2 IMPLANT
GLOVE BIOGEL PI IND STRL 6.5 (GLOVE) IMPLANT
GLOVE BIOGEL PI IND STRL 8 (GLOVE) ×1 IMPLANT
GLOVE BIOGEL PI INDICATOR 6.5 (GLOVE)
GLOVE BIOGEL PI INDICATOR 8 (GLOVE) ×1
GLOVE SURG SS PI 6.5 STRL IVOR (GLOVE) IMPLANT
GOWN STRL REUS W/TWL LRG LVL3 (GOWN DISPOSABLE) ×2 IMPLANT
GOWN STRL REUS W/TWL XL LVL3 (GOWN DISPOSABLE) IMPLANT
HANDPIECE INTERPULSE COAX TIP (DISPOSABLE) ×2
IMMOBILIZER KNEE 20 (SOFTGOODS) ×2
IMMOBILIZER KNEE 20 THIGH 36 (SOFTGOODS) ×1 IMPLANT
KIT BASIN OR (CUSTOM PROCEDURE TRAY) ×2 IMPLANT
MANIFOLD NEPTUNE II (INSTRUMENTS) ×2 IMPLANT
NDL SAFETY ECLIPSE 18X1.5 (NEEDLE) ×2 IMPLANT
NEEDLE HYPO 18GX1.5 SHARP (NEEDLE) ×4
NS IRRIG 1000ML POUR BTL (IV SOLUTION) ×2 IMPLANT
PACK TOTAL JOINT (CUSTOM PROCEDURE TRAY) ×2 IMPLANT
PAD ABD 8X10 STRL (GAUZE/BANDAGES/DRESSINGS) ×1 IMPLANT
PADDING CAST COTTON 6X4 STRL (CAST SUPPLIES) ×4 IMPLANT
PEN SKIN MARKING BROAD (MISCELLANEOUS) ×2 IMPLANT
POSITIONER SURGICAL ARM (MISCELLANEOUS) ×2 IMPLANT
SET HNDPC FAN SPRY TIP SCT (DISPOSABLE) ×1 IMPLANT
STRIP CLOSURE SKIN 1/2X4 (GAUZE/BANDAGES/DRESSINGS) ×3 IMPLANT
SUCTION FRAZIER 12FR DISP (SUCTIONS) ×2 IMPLANT
SUT MNCRL AB 4-0 PS2 18 (SUTURE) ×2 IMPLANT
SUT VIC AB 2-0 CT1 27 (SUTURE) ×6
SUT VIC AB 2-0 CT1 TAPERPNT 27 (SUTURE) ×3 IMPLANT
SUT VLOC 180 0 24IN GS25 (SUTURE) ×2 IMPLANT
SYR 20CC LL (SYRINGE) ×2 IMPLANT
SYR 50ML LL SCALE MARK (SYRINGE) ×2 IMPLANT
TOWEL OR 17X26 10 PK STRL BLUE (TOWEL DISPOSABLE) ×2 IMPLANT
TOWEL OR NON WOVEN STRL DISP B (DISPOSABLE) IMPLANT
TRAY FOLEY CATH 16FR SILVER (SET/KITS/TRAYS/PACK) ×1 IMPLANT
TRAY FOLEY CATH 16FRSI W/METER (SET/KITS/TRAYS/PACK) ×1 IMPLANT
WATER STERILE IRR 1500ML POUR (IV SOLUTION) ×2 IMPLANT
WRAP KNEE MAXI GEL POST OP (GAUZE/BANDAGES/DRESSINGS) ×2 IMPLANT
YANKAUER SUCT BULB TIP 10FT TU (MISCELLANEOUS) ×2 IMPLANT

## 2015-03-19 NOTE — Op Note (Signed)
Pre-operative diagnosis- Osteoarthritis  Right knee(s)  Post-operative diagnosis- Osteoarthritis Right knee(s)  Procedure-  Right  Total Knee Arthroplasty  Surgeon- Jeffrey Plover. Collis Thede, MD  Assistant- Ardeen Jourdain, PA-C   Anesthesia-  Spinal  EBL-* No blood loss amount entered *   Drains Hemovac  Tourniquet time-  Total Tourniquet Time Documented: Thigh (Right) - 36 minutes Total: Thigh (Right) - 36 minutes     Complications- None  Condition-PACU - hemodynamically stable.   Brief Clinical Note  Jeffrey Goodman is a 64 y.o. year old male with end stage OA of his right knee with progressively worsening pain and dysfunction. He has constant pain, with activity and at rest and significant functional deficits with difficulties even with ADLs. He has had extensive non-op management including analgesics, injections of cortisone and viscosupplements, and home exercise program, but remains in significant pain with significant dysfunction. Radiographs show bone on bone arthritis medial and patellofemoral. He presents now for right Total Knee Arthroplasty.    Procedure in detail---   The patient is brought into the operating room and positioned supine on the operating table. After successful administration of  Spinal,   a tourniquet is placed high on the  Right thigh(s) and the lower extremity is prepped and draped in the usual sterile fashion. Time out is performed by the operating team and then the  Right lower extremity is wrapped in Esmarch, knee flexed and the tourniquet inflated to 300 mmHg.       A midline incision is made with a ten blade through the subcutaneous tissue to the level of the extensor mechanism. A fresh blade is used to make a medial parapatellar arthrotomy. Soft tissue over the proximal medial tibia is subperiosteally elevated to the joint line with a knife and into the semimembranosus bursa with a Cobb elevator. Soft tissue over the proximal lateral tibia is elevated  with attention being paid to avoiding the patellar tendon on the tibial tubercle. The patella is everted, knee flexed 90 degrees and the ACL and PCL are removed. Findings are bone on bone medial and patellofemoral with large global osteophytes.        The drill is used to create a starting hole in the distal femur and the canal is thoroughly irrigated with sterile saline to remove the fatty contents. The 5 degree Right  valgus alignment guide is placed into the femoral canal and the distal femoral cutting block is pinned to remove 10 mm off the distal femur. Resection is made with an oscillating saw.      The tibia is subluxed forward and the menisci are removed. The extramedullary alignment guide is placed referencing proximally at the medial aspect of the tibial tubercle and distally along the second metatarsal axis and tibial crest. The block is pinned to remove 19mm off the more deficient medial  side. Resection is made with an oscillating saw. Size 7is the most appropriate size for the tibia and the proximal tibia is prepared with the modular drill and keel punch for that size.      The femoral sizing guide is placed and size 8 is most appropriate. Rotation is marked off the epicondylar axis and confirmed by creating a rectangular flexion gap at 90 degrees. The size 8 cutting block is pinned in this rotation and the anterior, posterior and chamfer cuts are made with the oscillating saw. The intercondylar block is then placed and that cut is made.      Trial size 7 tibial component,  trial size 8 posterior stabilized femur and a 8  mm posterior stabilized rotating platform insert trial is placed. Full extension is achieved with excellent varus/valgus and anterior/posterior balance throughout full range of motion. The patella is everted and thickness measured to be 27  mm. Free hand resection is taken to 15 mm, a 41 template is placed, lug holes are drilled, trial patella is placed, and it tracks normally.  Osteophytes are removed off the posterior femur with the trial in place. All trials are removed and the cut bone surfaces prepared with pulsatile lavage. Cement is mixed and once ready for implantation, the size 7 tibial implant, size  8 posterior stabilized femoral component, and the size 41 patella are cemented in place and the patella is held with the clamp. The trial insert is placed and the knee held in full extension. The Exparel (20 ml mixed with 30 ml saline) and .25% Bupivicaine, are injected into the extensor mechanism, posterior capsule, medial and lateral gutters and subcutaneous tissues.  All extruded cement is removed and once the cement is hard the permanent 8 mm posterior stabilized rotating platform insert is placed into the tibial tray.      The wound is copiously irrigated with saline solution and the extensor mechanism closed over a hemovac drain with #1 V-loc suture. The tourniquet is released for a total tourniquet time of 36  minutes. Flexion against gravity is 140 degrees and the patella tracks normally. Subcutaneous tissue is closed with 2.0 vicryl and subcuticular with running 4.0 Monocryl. The incision is cleaned and dried and steri-strips and a bulky sterile dressing are applied. The limb is placed into a knee immobilizer and the patient is awakened and transported to recovery in stable condition.      Please note that a surgical assistant was a medical necessity for this procedure in order to perform it in a safe and expeditious manner. Surgical assistant was necessary to retract the ligaments and vital neurovascular structures to prevent injury to them and also necessary for proper positioning of the limb to allow for anatomic placement of the prosthesis.   Jeffrey Plover [name], MD    03/19/2015, 12:15 PM

## 2015-03-19 NOTE — Anesthesia Procedure Notes (Signed)
Spinal Patient location during procedure: OR Start time: 03/19/2015 11:09 AM End time: 03/19/2015 11:15 AM Staffing Resident/CRNA: Lollie Sails Performed by: anesthesiologist and resident/CRNA  Preanesthetic Checklist Completed: patient identified, site marked, surgical consent, pre-op evaluation, timeout performed, IV checked, risks and benefits discussed and monitors and equipment checked Spinal Block Patient position: sitting Prep: Betadine Patient monitoring: heart rate, continuous pulse ox and blood pressure Location: L3-4 Injection technique: single-shot Needle Needle type: Sprotte  Needle gauge: 24 G Needle length: 10 cm Additional Notes Expiration date of kit checked and confirmed. Patient tolerated procedure well, without complications.  Good CSF flow noted.

## 2015-03-19 NOTE — Transfer of Care (Addendum)
Immediate Anesthesia Transfer of Care Note  Patient: Jeffrey Goodman  Procedure(s) Performed: Procedure(s): TOTAL KNEE ARTHROPLASTY (Right)  Patient Location: PACU  Anesthesia Type:Spinal  Level of Consciousness: awake, alert , oriented and patient cooperative  Airway & Oxygen Therapy: Patient Spontanous Breathing and Patient connected to face mask oxygen  Post-op Assessment: Report given to RN, Post -op Vital signs reviewed and stable and Patient moving all extremities X 4  Post vital signs: stable  Last Vitals:  Filed Vitals:   03/19/15 0923  BP: 161/94  Pulse:   Temp:   Resp:     Complications: No apparent anesthesia complications   s2 spinal level

## 2015-03-19 NOTE — Plan of Care (Signed)
Problem: Consults Goal: Diagnosis- Total Joint Replacement Primary Total Knee     

## 2015-03-19 NOTE — Interval H&P Note (Signed)
History and Physical Interval Note:  03/19/2015 10:10 AM  Jeffrey Goodman  has presented today for surgery, with the diagnosis of oa right knee  The various methods of treatment have been discussed with the patient and family. After consideration of risks, benefits and other options for treatment, the patient has consented to  Procedure(s): TOTAL KNEE ARTHROPLASTY (Right) as a surgical intervention .  The patient's history has been reviewed, patient examined, no change in status, stable for surgery.  I have reviewed the patient's chart and labs.  Questions were answered to the patient's satisfaction.     [name]

## 2015-03-19 NOTE — Progress Notes (Signed)
PT Cancellation Note  Patient Details Name: Jeffrey Goodman MRN: [id number] DOB: 1951/05/11   Cancelled Treatment:    Reason Eval/Treat Not Completed: Other (comment) (patient wanted to remain in [facility] so did not  attemp OOB. will dangle  with RN.)   Claretha Cooper 03/19/2015, 5:42 PM

## 2015-03-19 NOTE — Anesthesia Preprocedure Evaluation (Signed)
Anesthesia Evaluation  Patient identified by MRN, date of birth, ID band Patient awake    Reviewed: Allergy & Precautions, H&P , NPO status , Patient's Chart, lab work & pertinent test results, reviewed documented beta blocker date and time   Airway Mallampati: II  TM Distance: >3 FB Neck ROM: full    Dental no notable dental hx. (+) Teeth Intact, Dental Advisory Given   Pulmonary neg pulmonary ROS,  breath sounds clear to auscultation  Pulmonary exam normal       Cardiovascular hypertension, Pt. on medications Normal cardiovascular examRhythm:regular Rate:Normal     Neuro/Psych negative neurological ROS  negative psych ROS   GI/Hepatic negative GI ROS, Neg liver ROS, GERD-  Medicated and Controlled,  Endo/Other  negative endocrine ROS  Renal/GU negative Renal ROS  negative genitourinary   Musculoskeletal   Abdominal   Peds  Hematology negative hematology ROS (+)   Anesthesia Other Findings   Reproductive/Obstetrics negative OB ROS                             Anesthesia Physical  Anesthesia Plan  ASA: II  Anesthesia Plan: Spinal   Post-op Pain Management:    Induction:   Airway Management Planned: Simple Face Mask  Additional Equipment:   Intra-op Plan:   Post-operative Plan:   Informed Consent: I have reviewed the patients History and Physical, chart, labs and discussed the procedure including the risks, benefits and alternatives for the proposed anesthesia with the patient or authorized representative who has indicated his/her understanding and acceptance.   Dental Advisory Given  Plan Discussed with: CRNA and Surgeon  Anesthesia Plan Comments:         Anesthesia Quick Evaluation

## 2015-03-19 NOTE — H&P (View-Only) (Signed)
Jeffrey Goodman DOB: June 22, 1951 Divorced / Language: English / Race: White Male Date of Admission:  03/19/2015 CC:  Right Knee Pain History of Present Illness The patient is a 64 year old male who comes in for a preoperative History and Physical. The patient is scheduled for a right total knee arthroplasty to be performed by Dr. Dione Plover. Aluisio, MD at [facility] on 03/19/15. The patient is a 64 year old male who presents for follow up of their knee. The patient is being followed for their right knee pain and osteoarthritis. They are several months month(s) out from cortisone injection by Molli Barrows PA-C. Symptoms reported include: pain, aching and difficulty ambulating. and report their pain level to be mild to moderate. The following medication has been used for pain control: Hydrocodone. He said the right knee is getting progressively worse. It is hard for him to make it through a full day of work. The pain is progressively worse over time. He occasionally gets swelling. The knee feels, he wants to give out. He is ready to go ahead and get the knee replaced. Injections have not helped significantly. He has done well following the left knee replacement and now is ready to proceed with the right knee. They have been treated conservatively in the past for the above stated problem and despite conservative measures, they continue to have progressive pain and severe functional limitations and dysfunction. They have failed non-operative management including home exercise, medications, and injections. It is felt that they would benefit from undergoing total joint replacement. Risks and benefits of the procedure have been discussed with the patient and they elect to proceed with surgery. There are no active contraindications to surgery such as ongoing infection or rapidly progressive neurological disease.  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 mother and father Osteoporosis mother Father Deceased, Dementia. age 83  Social History Alcohol use current drinker; drinks wine; 5-7 per week Children 2 Current work status working full time Drug/Alcohol Rehab (Currently) no Drug/Alcohol Rehab (Previously) no Exercise Exercises weekly; does gym / weights Illicit drug use no Marital status divorced Number of flights of stairs before winded less than 1 Pain Contract no Tobacco / smoke exposure yes outdoors only Tobacco use Never smoker. never smoker Post-Surgical Plans Plan is to go home. Current occupation Sports coach  Medication History Metoprolol Tartrate (25MG  Tablet, Oral) Active. Naproxen Sodium (220MG  Tablet, Oral) Active. Diltiazem HCl ER Beads (240MG  Capsule ER 24HR, Oral two times daily) Active. GuanFACINE HCl (2MG  Tablet, Oral) Active. Hydrochlorothiazide (25MG  Tablet, Oral) Active. Medications Reconciled  Past Surgical History  Lithotripsy Twice Total Knee Replacement - Left  Review of Systems General Not Present- Chills, Fatigue, Fever, Memory Loss, Night Sweats, Weight Gain and Weight Loss. Skin Not Present- Eczema, Hives, Itching, Lesions and Rash. HEENT Not Present- Dentures, Double Vision, Headache, Hearing Loss, Tinnitus and Visual Loss. Respiratory Not Present- Allergies, Chronic Cough, Coughing up blood, Shortness of breath at rest and Shortness of breath with exertion. Cardiovascular Not Present- Chest Pain, Difficulty Breathing Lying Down, Murmur, Palpitations,  Racing/skipping heartbeats and Swelling. Gastrointestinal Not Present- Abdominal Pain, Bloody Stool, Constipation, Diarrhea, Difficulty Swallowing, Heartburn, Jaundice, Loss  of appetitie, Nausea and Vomiting. Male Genitourinary Not Present- Blood in Urine, Discharge, Flank Pain, Incontinence, Painful Urination, Urgency, Urinary frequency, Urinary Retention, Urinating at Night and Weak urinary stream. Musculoskeletal Present- Joint Pain and Joint Swelling. Not Present- Back Pain, Morning Stiffness, Muscle Pain, Muscle Weakness and Spasms. Neurological Not Present- Blackout spells, Difficulty with balance, Dizziness, Paralysis, Tremor and Weakness. Psychiatric Not Present- Insomnia.  Vitals Weight: 238 lb Height: 71in Weight was reported by patient. Height was reported by patient. Body Surface Area: 2.27 m Body Mass Index: 33.19 kg/m  BP: 140/78 (Sitting, Right Arm, Standard)  Physical Exam (Alezandrew L. Izzabelle Bouley III PA-C; 03/02/2015 10:39 AM) General Mental Status -Alert, cooperative and good historian. General Appearance-pleasant, Not in acute distress. Orientation-Oriented X3. Build & Nutrition-Well nourished and Well developed.  Head and Neck Head-normocephalic, atraumatic . Neck Global Assessment - supple, no bruit auscultated on the right, no bruit auscultated on the left.  Eye Vision-Wears corrective lenses. Pupil - Bilateral-Regular and Round. Motion - Bilateral-EOMI.  Chest and Lung Exam Auscultation Breath sounds - clear at anterior chest wall and clear at posterior chest wall. Adventitious sounds - No Adventitious sounds.  Cardiovascular Auscultation Rhythm - Regular rate and rhythm. Heart Sounds - S1 WNL and S2 WNL. Murmurs & Other Heart Sounds - Auscultation of the heart reveals - No Murmurs.  Abdomen Palpation/Percussion Tenderness - Abdomen is non-tender to palpation. Rigidity (guarding) - Abdomen is soft. Auscultation Auscultation of  the abdomen reveals - Bowel sounds normal.  Male Genitourinary Note: Not done, not pertinent to present illness  Musculoskeletal Note: Well-developed male, in no distress. His right knee shows no effusion. His range is about 5 to 125 with marked crepitus on range of motion, tenderness medial greater than lateral, no instability noted.  RADIOGRAPHS Most recent x-rays from a few months ago and he is bone-on-bone in the medial and patellofemoral compartments with significant varus deformity of the knee.  Assessment & Plan Primary osteoarthritis of one knee, right (M17.11) Note:Surgical Plans: Right Total Knee Replacement  Disposition: Home  PCP: Dr. Marlyn Corporal  IV TXA  Anesthesia Issues: None  Signed electronically by Ok Edwards, III PA-C

## 2015-03-19 NOTE — Progress Notes (Signed)
Utilization review completed.  

## 2015-03-20 ENCOUNTER — Encounter (HOSPITAL_COMMUNITY): Payer: Self-pay | Admitting: Orthopedic Surgery

## 2015-03-20 LAB — CBC
HCT: 41.3 % (ref 39.0–52.0)
Hemoglobin: 14.4 g/dL (ref 13.0–17.0)
MCH: 32.5 pg (ref 26.0–34.0)
MCHC: 34.9 g/dL (ref 30.0–36.0)
MCV: 93.2 fL (ref 78.0–100.0)
PLATELETS: 185 10*3/uL (ref 150–400)
RBC: 4.43 MIL/uL (ref 4.22–5.81)
RDW: 12 % (ref 11.5–15.5)
WBC: 16.7 10*3/uL — AB (ref 4.0–10.5)

## 2015-03-20 LAB — BASIC METABOLIC PANEL
Anion gap: 8 (ref 5–15)
BUN: 12 mg/dL (ref 6–20)
CO2: 25 mmol/L (ref 22–32)
CREATININE: 0.82 mg/dL (ref 0.61–1.24)
Calcium: 8.6 mg/dL — ABNORMAL LOW (ref 8.9–10.3)
Chloride: 100 mmol/L — ABNORMAL LOW (ref 101–111)
GFR calc non Af Amer: >60 mL/min (ref >60)
GLUCOSE: 167 mg/dL — AB (ref 65–99)
Potassium: 3.9 mmol/L (ref 3.5–5.1)
SODIUM: 133 mmol/L — AB (ref 135–145)

## 2015-03-20 MED ORDER — OXYCODONE HCL 5 MG PO TABS
5.0000 mg | ORAL_TABLET | ORAL | Status: DC | PRN
  Administered 2015-03-21 (×4): 15 mg via ORAL
  Filled 2015-03-20 (×4): qty 3

## 2015-03-20 MED ORDER — METHOCARBAMOL 500 MG PO TABS: 500.0000 mg | ORAL_TABLET | Freq: Four times a day (QID) | ORAL | Status: DC | PRN

## 2015-03-20 MED ORDER — RIVAROXABAN 10 MG PO TABS: 10.0000 mg | ORAL_TABLET | Freq: Every day | ORAL | Status: DC

## 2015-03-20 MED ORDER — OMEPRAZOLE 20 MG PO CPDR
20.0000 mg | DELAYED_RELEASE_CAPSULE | Freq: Every day | ORAL | Status: DC
  Administered 2015-03-21: 20 mg via ORAL
  Filled 2015-03-20 (×2): qty 1

## 2015-03-20 MED ORDER — HYDROMORPHONE HCL 1 MG/ML IJ SOLN
INTRAMUSCULAR | Status: AC
  Administered 2015-03-20: 1 mg
  Filled 2015-03-20: qty 1

## 2015-03-20 MED ORDER — HYDROMORPHONE HCL 1 MG/ML IJ SOLN: 0.5000 mg | INTRAMUSCULAR | Status: DC | PRN

## 2015-03-20 MED ORDER — ALUM & MAG HYDROXIDE-SIMETH 200-200-20 MG/5ML PO SUSP
30.0000 mL | Freq: Four times a day (QID) | ORAL | Status: DC | PRN
  Administered 2015-03-20 (×2): 30 mL via ORAL
  Filled 2015-03-20 (×2): qty 30

## 2015-03-20 MED ORDER — OXYCODONE HCL 5 MG PO TABS: 5.0000 mg | ORAL_TABLET | ORAL | Status: DC | PRN

## 2015-03-20 MED ORDER — CYCLOBENZAPRINE HCL 10 MG PO TABS
10.0000 mg | ORAL_TABLET | Freq: Three times a day (TID) | ORAL | Status: DC | PRN
  Administered 2015-03-21 (×2): 10 mg via ORAL
  Filled 2015-03-20 (×2): qty 1

## 2015-03-20 MED ORDER — TRAMADOL HCL 50 MG PO TABS: 50.0000 mg | ORAL_TABLET | Freq: Four times a day (QID) | ORAL | Status: DC | PRN

## 2015-03-20 NOTE — Discharge Summary (Signed)
Physician Discharge Summary   Patient ID: Jeffrey Goodman MRN: [id number] DOB/AGE: [birth year] 62 y.o.  Admit date: 03/19/2015 Discharge date: 03/21/2015  Primary Diagnosis:  Osteoarthritis Right knee(s)  Admission Diagnoses:  Past Medical History  Diagnosis Date  . Hypertension   . Kidney stones   . GERD (gastroesophageal reflux disease)   . Arthritis     OA BOTH KNEES  . Sinusitis, chronic   . Diverticulosis   . Hemorrhoids    Discharge Diagnoses:   Active Problems:   OA (osteoarthritis) of knee  Estimated body mass index is 32.65 kg/(m^2) as calculated from the following:   Height as of this encounter: '5\' 11"'  (1.803 m).   Weight as of this encounter: 106.142 kg (234 lb).  Procedure:  Procedure(s) (LRB): TOTAL KNEE ARTHROPLASTY (Right)   Consults: None  HPI: Jeffrey Goodman is a 64 y.o. year old male with end stage OA of his right knee with progressively worsening pain and dysfunction. He has constant pain, with activity and at rest and significant functional deficits with difficulties even with ADLs. He has had extensive non-op management including analgesics, injections of cortisone and viscosupplements, and home exercise program, but remains in significant pain with significant dysfunction. Radiographs show bone on bone arthritis medial and patellofemoral. He presents now for right Total Knee Arthroplasty.  Laboratory Data: Admission on 03/19/2015  Component Date Value Ref Range Status  . ABO/RH(D) 03/19/2015 O POS   Final  . Antibody Screen 03/19/2015 NEG   Final  . Sample Expiration 03/19/2015 03/22/2015   Final  . WBC 03/20/2015 16.7* 4.0 - 10.5 K/uL Final  . RBC 03/20/2015 4.43  4.22 - 5.81 MIL/uL Final  . Hemoglobin 03/20/2015 14.4  13.0 - 17.0 g/dL Final  . HCT 03/20/2015 41.3  39.0 - 52.0 % Final  . MCV 03/20/2015 93.2  78.0 - 100.0 fL Final  . MCH 03/20/2015 32.5  26.0 - 34.0 pg Final  . MCHC 03/20/2015 34.9  30.0 - 36.0 g/dL Final  . RDW 03/20/2015  12.0  11.5 - 15.5 % Final  . Platelets 03/20/2015 185  150 - 400 K/uL Final  . Sodium 03/20/2015 133* 135 - 145 mmol/L Final  . Potassium 03/20/2015 3.9  3.5 - 5.1 mmol/L Final  . Chloride 03/20/2015 100* 101 - 111 mmol/L Final  . CO2 03/20/2015 25  22 - 32 mmol/L Final  . Glucose, Bld 03/20/2015 167* 65 - 99 mg/dL Final  . BUN 03/20/2015 12  6 - 20 mg/dL Final  . Creatinine, Ser 03/20/2015 0.82  0.61 - 1.24 mg/dL Final  . Calcium 03/20/2015 8.6* 8.9 - 10.3 mg/dL Final  . GFR calc non Af Amer 03/20/2015 >60  >60 mL/min Final  . GFR calc Af Amer 03/20/2015 >60  >60 mL/min Final   Comment: (NOTE) The eGFR has been calculated using the CKD EPI equation. This calculation has not been validated in all clinical situations. eGFR's persistently <60 mL/min signify possible Chronic Kidney Disease.   Georgiann Hahn gap 03/20/2015 8  5 - 15 Final  Hospital Outpatient Visit on 03/14/2015  Component Date Value Ref Range Status  . MRSA, PCR 03/14/2015 NEGATIVE  NEGATIVE Final  . Staphylococcus aureus 03/14/2015 NEGATIVE  NEGATIVE Final   Comment:        The Xpert SA Assay (FDA approved for NASAL specimens in patients over 48 years of age), is one component of a comprehensive surveillance program.  Test performance has been validated by [facility] for patients greater than  or equal to 88 year old. It is not intended to diagnose infection nor to guide or monitor treatment.   Marland Kitchen aPTT 03/14/2015 32  24 - 37 seconds Final  . WBC 03/14/2015 7.6  4.0 - 10.5 K/uL Final  . RBC 03/14/2015 5.04  4.22 - 5.81 MIL/uL Final  . Hemoglobin 03/14/2015 16.5  13.0 - 17.0 g/dL Final  . HCT 03/14/2015 48.7  39.0 - 52.0 % Final  . MCV 03/14/2015 96.6  78.0 - 100.0 fL Final  . MCH 03/14/2015 32.7  26.0 - 34.0 pg Final  . MCHC 03/14/2015 33.9  30.0 - 36.0 g/dL Final  . RDW 03/14/2015 12.6  11.5 - 15.5 % Final  . Platelets 03/14/2015 222  150 - 400 K/uL Final  . Sodium 03/14/2015 138  135 - 145 mmol/L Final  .  Potassium 03/14/2015 4.4  3.5 - 5.1 mmol/L Final  . Chloride 03/14/2015 101  101 - 111 mmol/L Final  . CO2 03/14/2015 30  22 - 32 mmol/L Final  . Glucose, Bld 03/14/2015 119* 65 - 99 mg/dL Final  . BUN 03/14/2015 21* 6 - 20 mg/dL Final  . Creatinine, Ser 03/14/2015 1.09  0.61 - 1.24 mg/dL Final  . Calcium 03/14/2015 9.6  8.9 - 10.3 mg/dL Final  . Total Protein 03/14/2015 7.8  6.5 - 8.1 g/dL Final  . Albumin 03/14/2015 4.3  3.5 - 5.0 g/dL Final  . AST 03/14/2015 30  15 - 41 U/L Final  . ALT 03/14/2015 34  17 - 63 U/L Final  . Alkaline Phosphatase 03/14/2015 84  38 - 126 U/L Final  . Total Bilirubin 03/14/2015 0.9  0.3 - 1.2 mg/dL Final  . GFR calc non Af Amer 03/14/2015 >60  >60 mL/min Final  . GFR calc Af Amer 03/14/2015 >60  >60 mL/min Final   Comment: (NOTE) The eGFR has been calculated using the CKD EPI equation. This calculation has not been validated in all clinical situations. eGFR's persistently <60 mL/min signify possible Chronic Kidney Disease.   . Anion gap 03/14/2015 7  5 - 15 Final  . Prothrombin Time 03/14/2015 14.7  11.6 - 15.2 seconds Final  . INR 03/14/2015 1.13  0.00 - 1.49 Final  . Color, Urine 03/14/2015 YELLOW  YELLOW Final  . APPearance 03/14/2015 CLEAR  CLEAR Final  . Specific Gravity, Urine 03/14/2015 1.026  1.005 - 1.030 Final  . pH 03/14/2015 6.5  5.0 - 8.0 Final  . Glucose, UA 03/14/2015 NEGATIVE  NEGATIVE mg/dL Final  . Hgb urine dipstick 03/14/2015 NEGATIVE  NEGATIVE Final  . Bilirubin Urine 03/14/2015 SMALL* NEGATIVE Final  . Ketones, ur 03/14/2015 NEGATIVE  NEGATIVE mg/dL Final  . Protein, ur 03/14/2015 NEGATIVE  NEGATIVE mg/dL Final  . Urobilinogen, UA 03/14/2015 1.0  0.0 - 1.0 mg/dL Final  . Nitrite 03/14/2015 NEGATIVE  NEGATIVE Final  . Leukocytes, UA 03/14/2015 NEGATIVE  NEGATIVE Final   MICROSCOPIC NOT DONE ON URINES WITH NEGATIVE PROTEIN, BLOOD, LEUKOCYTES, NITRITE, OR GLUCOSE <1000 mg/dL.     X-Rays:No results found.  EKG: Orders placed  or performed during the hospital encounter of 03/14/15  . EKG 12-Lead  . EKG 12-Lead     Hospital Course: Jeffrey Goodman is a 64 y.o. who was admitted to [facility]. They were brought to the operating room on 03/19/2015 and underwent Procedure(s): TOTAL KNEE ARTHROPLASTY.  Patient tolerated the procedure well and was later transferred to the recovery room and then to the orthopaedic floor for postoperative care.  They were given PO and IV analgesics for pain control following their surgery.  They were given 24 hours of postoperative antibiotics of  Anti-infectives    Start     Dose/Rate Route Frequency Ordered Stop   03/19/15 1800  ceFAZolin (ANCEF) IVPB 2 g/50 mL premix     2 g 100 mL/hr over 30 Minutes Intravenous Every 6 hours 03/19/15 1358 03/20/15 0047   03/19/15 0827  ceFAZolin (ANCEF) IVPB 2 g/50 mL premix     2 g 100 mL/hr over 30 Minutes Intravenous On call to O.R. 03/19/15 0827 03/19/15 1111     and started on DVT prophylaxis in the form of Xarelto.   PT and OT were ordered for total joint protocol.  Discharge planning consulted to help with postop disposition and equipment needs.  Patient had a good night on the evening of surgery.  They started to get up OOB with therapy on day one. Hemovac drain was pulled without difficulty.  Continued to work with therapy into day two.  Dressing was changed on day two and the incision was healing well. Patient was seen in rounds and was ready to go home.  Discharge home with home health Diet - Cardiac diet Follow up - in 2 weeks Activity - WBAT Disposition - Home Condition Upon Discharge - Good D/C Meds - See DC Summary DVT Prophylaxis - Xarelto  Discharge Instructions    Call MD / Call 911    Complete by:  As directed   If you experience chest pain or shortness of breath, CALL 911 and be transported to the hospital emergency room.  If you develope a fever above 101 F, pus (white drainage) or increased drainage or redness at  the wound, or calf pain, call your surgeon's office.     Change dressing    Complete by:  As directed   Change dressing daily with sterile 4 x 4 inch gauze dressing and apply TED hose. Do not submerge the incision under water.     Constipation Prevention    Complete by:  As directed   Drink plenty of fluids.  Prune juice may be helpful.  You may use a stool softener, such as Colace (over the counter) 100 mg twice a day.  Use MiraLax (over the counter) for constipation as needed.     Diet - low sodium heart healthy    Complete by:  As directed      Discharge instructions    Complete by:  As directed   Pick up stool softner and laxative for home use following surgery while on pain medications. Do not submerge incision under water. Please use good hand washing techniques while changing dressing each day. May shower starting three days after surgery. Please use a clean towel to pat the incision dry following showers. Continue to use ice for pain and swelling after surgery. Do not use any lotions or creams on the incision until instructed by your surgeon.  Take Xarelto for two and a half more weeks, then discontinue Xarelto. Once the patient has completed the blood thinner regimen, then take a Baby 81 mg Aspirin daily for three more weeks.  Postoperative Constipation Protocol  Constipation - defined medically as fewer than three stools per week and severe constipation as less than one stool per week.  One of the most common issues patients have following surgery is constipation.  Even if you have a regular bowel pattern at home, your normal regimen is likely to be disrupted  due to multiple reasons following surgery.  Combination of anesthesia, postoperative narcotics, change in appetite and fluid intake all can affect your bowels.  In order to avoid complications following surgery, here are some recommendations in order to help you during your recovery period.  Colace (docusate) - Pick up an  over-the-counter form of Colace or another stool softener and take twice a day as long as you are requiring postoperative pain medications.  Take with a full glass of water daily.  If you experience loose stools or diarrhea, hold the colace until you stool forms back up.  If your symptoms do not get better within 1 week or if they get worse, check with your doctor.  Dulcolax (bisacodyl) - Pick up over-the-counter and take as directed by the product packaging as needed to assist with the movement of your bowels.  Take with a full glass of water.  Use this product as needed if not relieved by Colace only.   MiraLax (polyethylene glycol) - Pick up over-the-counter to have on hand.  MiraLax is a solution that will increase the amount of water in your bowels to assist with bowel movements.  Take as directed and can mix with a glass of water, juice, soda, coffee, or tea.  Take if you go more than two days without a movement. Do not use MiraLax more than once per day. Call your doctor if you are still constipated or irregular after using this medication for 7 days in a row.  If you continue to have problems with postoperative constipation, please contact the office for further assistance and recommendations.  If you experience "the worst abdominal pain ever" or develop nausea or vomiting, please contact the office immediatly for further recommendations for treatment.     Do not put a pillow under the knee. Place it under the heel.    Complete by:  As directed      Do not sit on low chairs, stoools or toilet seats, as it may be difficult to get up from low surfaces    Complete by:  As directed      Driving restrictions    Complete by:  As directed   No driving until released by the physician.     Increase activity slowly as tolerated    Complete by:  As directed      Lifting restrictions    Complete by:  As directed   No lifting until released by the physician.     Patient may shower    Complete by:  As  directed   You may shower without a dressing once there is no drainage.  Do not wash over the wound.  If drainage remains, do not shower until drainage stops.     TED hose    Complete by:  As directed   Use stockings (TED hose) for 3 weeks on both leg(s).  You may remove them at night for sleeping.     Weight bearing as tolerated    Complete by:  As directed   Laterality:  right  Extremity:  Lower            Medication List    STOP taking these medications        Co Q 10 100 MG Caps     HYDROcodone-acetaminophen 5-325 MG per tablet  Commonly known as:  NORCO/VICODIN     naproxen sodium 220 MG tablet  Commonly known as:  ANAPROX     OVER THE COUNTER MEDICATION  Red Yeast Rice 600 MG Caps      TAKE these medications        acetaminophen 500 MG tablet  Commonly known as:  TYLENOL  Take 1,000 mg by mouth every 6 (six) hours as needed for pain.     BENADRYL 25 mg capsule  Generic drug:  diphenhydrAMINE  Take 50 mg by mouth every 6 (six) hours as needed for itching or allergies.     diltiazem 240 MG 24 hr capsule  Commonly known as:  CARDIZEM CD  Take 480 mg by mouth every morning.     guanFACINE 2 MG tablet  Commonly known as:  TENEX  Take 2 mg by mouth every morning.     hydrochlorothiazide 25 MG tablet  Commonly known as:  HYDRODIURIL  Take 25 mg by mouth every morning.     methocarbamol 500 MG tablet  Commonly known as:  ROBAXIN  Take 1 tablet (500 mg total) by mouth every 6 (six) hours as needed for muscle spasms.     metoprolol tartrate 25 MG tablet  Commonly known as:  LOPRESSOR  Take 50 mg by mouth daily.     oxyCODONE 5 MG immediate release tablet  Commonly known as:  Oxy IR/ROXICODONE  Take 1-2 tablets (5-10 mg total) by mouth every 3 (three) hours as needed for moderate pain or severe pain.     oxymetazoline 0.05 % nasal spray  Commonly known as:  AFRIN  Place 1 spray into the nose 2 (two) times daily.     rivaroxaban 10 MG Tabs tablet    Commonly known as:  XARELTO  Take 1 tablet (10 mg total) by mouth daily with breakfast. Take Xarelto for two and a half more weeks, then discontinue Xarelto. Once the patient has completed the blood thinner regimen, then take a Baby 81 mg Aspirin daily for three more weeks.     traMADol 50 MG tablet  Commonly known as:  ULTRAM  Take 1-2 tablets (50-100 mg total) by mouth every 6 (six) hours as needed (mild pain).           Follow-up Information    Follow up with [facility].   Why:  home health physical therapy   Contact information:   [address] [place] [address] [phone]       Follow up with [name], MD. Schedule an appointment as soon as possible for a visit in 2 weeks.   Specialty:  Orthopedic Surgery   Why:  Call office at [phone] to setup appointment with the PA for followup.   Contact information:   [address] [place] [address] [phone]       Signed: Arlee Muslim, PA-C Orthopaedic Surgery 03/20/2015, 9:48 PM

## 2015-03-20 NOTE — Progress Notes (Signed)
   Subjective: 1 Day Post-Op Procedure(s) (LRB): TOTAL KNEE ARTHROPLASTY (Right) Patient reports pain as mild.   Patient seen in rounds with Dr. Wynelle Link. Patient is well, and has had no acute complaints or problems We will start therapy today.  Plan is to go Home after hospital stay.  Objective: Vital signs in last 24 hours: Temp:  [97.5 F (36.4 C)-99 F (37.2 C)] 98.1 F (36.7 C) (07/12 0559) Pulse Rate:  [39-62] 59 (07/12 0559) Resp:  [8-18] 16 (07/12 0559) BP: (122-164)/(69-94) 164/85 mmHg (07/12 0559) SpO2:  [96 %-99 %] 99 % (07/12 0559) Weight:  [106.142 kg (234 lb)] 106.142 kg (234 lb) (07/11 0857)  Intake/Output from previous day:  Intake/Output Summary (Last 24 hours) at 03/20/15 0848 Last data filed at 03/20/15 0640  Gross per 24 hour  Intake 5711.66 ml  Output   4565 ml  Net 1146.66 ml    Intake/Output this shift: UOP 1450 since around MN  Labs:  Recent Labs  03/20/15 0418  HGB 14.4    Recent Labs  03/20/15 0418  WBC 16.7*  RBC 4.43  HCT 41.3  PLT 185    Recent Labs  03/20/15 0418  NA 133*  K 3.9  CL 100*  CO2 25  BUN 12  CREATININE 0.82  GLUCOSE 167*  CALCIUM 8.6*   No results for input(s): LABPT, INR in the last 72 hours.  EXAM General - Patient is Alert, Appropriate and Oriented Extremity - Neurovascular intact Sensation intact distally Dorsiflexion/Plantar flexion intact Dressing - dressing C/D/I Motor Function - intact, moving foot and toes well on exam.  Hemovac pulled without difficulty.  Past Medical History  Diagnosis Date  . Hypertension   . Kidney stones   . GERD (gastroesophageal reflux disease)   . Arthritis     OA BOTH KNEES  . Sinusitis, chronic   . Diverticulosis   . Hemorrhoids     Assessment/Plan: 1 Day Post-Op Procedure(s) (LRB): TOTAL KNEE ARTHROPLASTY (Right) Active Problems:   OA (osteoarthritis) of knee  Estimated body mass index is 32.65 kg/(m^2) as calculated from the following:   Height  as of this encounter: 5\' 11"  (1.803 m).   Weight as of this encounter: 106.142 kg (234 lb). Advance diet Up with therapy Plan for discharge tomorrow Discharge home with home health  DVT Prophylaxis - Xarelto Weight-Bearing as tolerated to right leg D/C O2 and Pulse OX and try on Room Air  Arlee Muslim, PA-C Orthopaedic Surgery 03/20/2015, 8:48 AM

## 2015-03-20 NOTE — Evaluation (Addendum)
Occupational Therapy Evaluation Patient Details Name: Jeffrey Goodman MRN: [id number] DOB: 1950/12/17 Today's Date: 03/20/2015    History of Present Illness R TKA   Clinical Impression   Pt doing well but does need some cues for safety. Discussed tub DME options at length as well as AE options. Pt would like to practice with AE further and case manager made aware that he has questions regarding pricing of tubbench. Will follow on acute to progress ADL independence for d/c home with family.     Follow Up Recommendations  No OT follow up;Supervision/Assistance - 24 hour    Equipment Recommendations  None recommended by OT    Recommendations for Other Services       Precautions / Restrictions Precautions Precautions: Knee Required Braces or Orthoses: Knee Immobilizer - Right Knee Immobilizer - Right: Discontinue once straight leg raise with < 10 degree lag Restrictions Weight Bearing Restrictions: No Other Position/Activity Restrictions: WBAT      Mobility Bed Mobility              General bed mobility comments: in chair.   Transfers Overall transfer level: Needs assistance Equipment used: Rolling walker (2 wheeled) Transfers: Sit to/from Stand Sit to Stand: Min guard;Min assist         General transfer comment: cues for hand placement    Balance                                           ADL Overall ADL's : Needs assistance/impaired Eating/Feeding: Independent;Sitting   Grooming: Wash/dry hands;Set up;Sitting   Upper Body Bathing: Set up;Sitting   Lower Body Bathing: Moderate assistance;Sit to/from stand   Upper Body Dressing : Set up;Sitting   Lower Body Dressing: Moderate assistance;Sit to/from stand   Toilet Transfer: Minimal assistance;Ambulation;BSC;RW   Toileting- Clothing Manipulation and Hygiene: Minimal assistance;Sit to/from stand         General ADL Comments: Discussed tub transfer bench option and pt  inquiring on cost of seat. Informed case manager that pt has questions regarding tubbench cost. Pt also has a shoe horn but interested in other AE. Demonstrated all AE and pt states he plans to purchase AE kit. He would like the extra long reacher so will plan to exchange out for extra longer reacher once kit is purchased. Pt has a higher commode but only a vanity that is not within reach. Emphasized not using walker to pull up from commode or hold to to sit down. Discussed need for stable surface and recommended he use 3in1 initially for UE support. Pt agreeable to this.      Vision     Perception     Praxis      Pertinent Vitals/Pain Pain Assessment: 0-10 Pain Score: 5  Pain Location: R knee with activity Pain Descriptors / Indicators: Aching Pain Intervention(s): Repositioned;Patient requesting pain meds-RN notified;Ice applied     Hand Dominance Right   Extremity/Trunk Assessment Upper Extremity Assessment Upper Extremity Assessment: Overall WFL for tasks assessed      Cervical / Trunk Assessment Cervical / Trunk Assessment: Normal   Communication Communication Communication: No difficulties   Cognition Arousal/Alertness: Awake/alert Behavior During Therapy: WFL for tasks assessed/performed Overall Cognitive Status: Within Functional Limits for tasks assessed                     General Comments  Exercises       Shoulder Instructions      Home Living Family/patient expects to be discharged to:: Private residence Living Arrangements: Other relatives Available Help at Discharge: Family;Available 24 hours/day Type of Home: House Home Access: Stairs to enter [employer] of Steps: 2 Entrance Stairs-Rails: Right Home Layout: One level     Bathroom Shower/Tub: Teacher, early years/pre: Handicapped height     Home Equipment: Environmental consultant - 2 wheels;Cane - single point;Adaptive equipment;Bedside commode Adaptive Equipment: Other  (Comment);Long-handled shoe horn (leg lifter)        Prior Functioning/Environment Level of Independence: Independent             OT Diagnosis: Generalized weakness   OT Problem List: Decreased strength;Decreased knowledge of use of DME or AE   OT Treatment/Interventions:      OT Goals(Current goals can be found in the care plan section) Acute Rehab OT Goals Patient Stated Goal: return to work as Pharmacist, hospital at [employer] OT Goal Formulation: With patient Time For Goal Achievement: 03/27/15 Potential to Achieve Goals: Good  OT Frequency:     Barriers to D/C:            Co-evaluation              End of Session Equipment Utilized During Treatment: Gait belt;Rolling walker;Right knee immobilizer  Activity Tolerance: Patient tolerated treatment well Patient left: in chair;with call bell/phone within reach   Time: 1100-1130 OT Time Calculation (min): 30 min Charges:  OT General Charges $OT Visit: 1 Procedure OT Evaluation $Initial OT Evaluation Tier I: 1 Procedure OT Treatments $Therapeutic Activity: 8-22 mins G-Codes:    Jules Schick  [phone] 03/20/2015, 12:37 PM

## 2015-03-20 NOTE — Anesthesia Postprocedure Evaluation (Signed)
  Anesthesia Post-op Note  Patient: Jeffrey Goodman  Procedure(s) Performed: Procedure(s) (LRB): TOTAL KNEE ARTHROPLASTY (Right)  Patient Location: PACU  Anesthesia Type: Spinal  Level of Consciousness: awake and alert   Airway and Oxygen Therapy: Patient Spontanous Breathing  Post-op Pain: mild  Post-op Assessment: Post-op Vital signs reviewed, Patient's Cardiovascular Status Stable, Respiratory Function Stable, Patent Airway and No signs of Nausea or vomiting  Last Vitals:  Filed Vitals:   03/20/15 1012  BP: 181/94  Pulse: 74  Temp: 37 C  Resp: 16    Post-op Vital Signs: stable   Complications: No apparent anesthesia complications

## 2015-03-20 NOTE — Progress Notes (Signed)
Physical Therapy Treatment Patient Details Name: Jeffrey Goodman MRN: [id number] DOB: Mar 02, 1951 Today's Date: 03/20/2015    History of Present Illness R TKA    PT Comments    Progressing with ROM and functional mobility.  Follow Up Recommendations  Home health PT     Equipment Recommendations  None recommended by PT    Recommendations for Other Services       Precautions / Restrictions Precautions Precautions: Knee Required Braces or Orthoses: Knee Immobilizer - Right Knee Immobilizer - Right: Discontinue once straight leg raise with < 10 degree lag    Mobility  Bed Mobility Overal bed mobility: Modified Independent                Transfers Overall transfer level: Needs assistance Equipment used: Rolling walker (2 wheeled) Transfers: Sit to/from Stand Sit to Stand: Min guard         General transfer comment: cues for hand placement  Ambulation/Gait Ambulation/Gait assistance: Min guard Ambulation Distance (Feet): 130 Feet Assistive device: Rolling walker (2 wheeled) Gait Pattern/deviations: Step-to pattern;Decreased step length - right;Decreased stance time - right     General Gait Details: steady with RW, no LOB, verbal cues to lift head   Stairs            Wheelchair Mobility    Modified Rankin (Stroke Patients Only)       Balance                                    Cognition Arousal/Alertness: Awake/alert                          Exercises Total Joint Exercises Quad Sets: AROM;Both;10 reps;Supine Heel Slides: AAROM;Right;10 reps;Supine Hip ABduction/ADduction: Right;AAROM;10 reps;Supine Straight Leg Raises: AAROM;Right;10 reps;Supine    General Comments        Pertinent Vitals/Pain Pain Score: 3  Pain Location: R knee Pain Descriptors / Indicators: Aching Pain Intervention(s): Premedicated before session;Repositioned    Home Living                      Prior Function             PT Goals (current goals can now be found in the care plan section) Progress towards PT goals: Progressing toward goals    Frequency  7X/week    PT Plan Current plan remains appropriate    Co-evaluation             End of Session Equipment Utilized During Treatment: Right knee immobilizer Activity Tolerance: Patient tolerated treatment well Patient left: in bed;with call bell/phone within reach     Time: 5885-0277 PT Time Calculation (min) (ACUTE ONLY): 40 min  Charges:  $Gait Training: 8-22 mins $Therapeutic Exercise: 8-22 mins $Therapeutic Activity: 8-22 mins                    G Codes:      Claretha Cooper 03/20/2015, 5:25 PM Tresa Endo PT [phone]

## 2015-03-20 NOTE — Discharge Instructions (Addendum)
° °Dr. Frank Aluisio °Total Joint Specialist °[facility] °[address] °[place], [place] [address] °[phone] ° °TOTAL KNEE REPLACEMENT POSTOPERATIVE DIRECTIONS ° °Knee Rehabilitation, Guidelines Following Surgery  °Results after knee surgery are often greatly improved when you follow the exercise, range of motion and muscle strengthening exercises prescribed by your doctor. Safety measures are also important to protect the knee from further injury. Any time any of these exercises cause you to have increased pain or swelling in your knee joint, decrease the amount until you are comfortable again and slowly increase them. If you have problems or questions, call your caregiver or physical therapist for advice.  ° °HOME CARE INSTRUCTIONS  °Remove items at home which could result in a fall. This includes throw rugs or furniture in walking pathways.  °· ICE to the affected knee every three hours for 30 minutes at a time and then as needed for pain and swelling.  Continue to use ice on the knee for pain and swelling from surgery. You may notice swelling that will progress down to the foot and ankle.  This is normal after surgery.  Elevate the leg when you are not up walking on it.   °· Continue to use the breathing machine which will help keep your temperature down.  It is common for your temperature to cycle up and down following surgery, especially at night when you are not up moving around and exerting yourself.  The breathing machine keeps your lungs expanded and your temperature down. °· Do not place pillow under knee, focus on keeping the knee straight while resting ° °DIET °You may resume your previous home diet once your are discharged from the hospital. ° °DRESSING / WOUND CARE / SHOWERING °You may shower 3 days after surgery, but keep the wounds dry during showering.  You may use an occlusive plastic wrap (Press'n Seal for example), NO SOAKING/SUBMERGING IN THE BATHTUB.  If the  bandage gets wet, change with a clean dry gauze.  If the incision gets wet, pat the wound dry with a clean towel. °You may start showering once you are discharged home but do not submerge the incision under water. Just pat the incision dry and apply a dry gauze dressing on daily. °Change the surgical dressing daily and reapply a dry dressing each time. ° °ACTIVITY °Walk with your walker as instructed. °Use walker as long as suggested by your caregivers. °Avoid periods of inactivity such as sitting longer than an hour when not asleep. This helps prevent blood clots.  °You may resume a sexual relationship in one month or when given the OK by your doctor.  °You may return to work once you are cleared by your doctor.  °Do not drive a car for 6 weeks or until released by you surgeon.  °Do not drive while taking narcotics. ° °WEIGHT BEARING °Weight bearing as tolerated with assist device (walker, cane, etc) as directed, use it as long as suggested by your surgeon or therapist, typically at least 4-6 weeks. ° °POSTOPERATIVE CONSTIPATION PROTOCOL °Constipation - defined medically as fewer than three stools per week and severe constipation as less than one stool per week. ° °One of the most common issues patients have following surgery is constipation.  Even if you have a regular bowel pattern at home, your normal regimen is likely to be disrupted due to multiple reasons following surgery.  Combination of anesthesia, postoperative narcotics, change in appetite and fluid intake all can affect your bowels.    In order to avoid complications following surgery, here are some recommendations in order to help you during your recovery period. ° °Colace (docusate) - Pick up an over-the-counter form of Colace or another stool softener and take twice a day as long as you are requiring postoperative pain medications.  Take with a full glass of water daily.  If you experience loose stools or diarrhea, hold the colace until you stool forms  back up.  If your symptoms do not get better within 1 week or if they get worse, check with your doctor. ° °Dulcolax (bisacodyl) - Pick up over-the-counter and take as directed by the product packaging as needed to assist with the movement of your bowels.  Take with a full glass of water.  Use this product as needed if not relieved by Colace only.  ° °MiraLax (polyethylene glycol) - Pick up over-the-counter to have on hand.  MiraLax is a solution that will increase the amount of water in your bowels to assist with bowel movements.  Take as directed and can mix with a glass of water, juice, soda, coffee, or tea.  Take if you go more than two days without a movement. °Do not use MiraLax more than once per day. Call your doctor if you are still constipated or irregular after using this medication for 7 days in a row. ° °If you continue to have problems with postoperative constipation, please contact the office for further assistance and recommendations.  If you experience "the worst abdominal pain ever" or develop nausea or vomiting, please contact the office immediatly for further recommendations for treatment. ° °ITCHING ° If you experience itching with your medications, try taking only a single pain pill, or even half a pain pill at a time.  You can also use Benadryl over the counter for itching or also to help with sleep.  ° °TED HOSE STOCKINGS °Wear the elastic stockings on both legs for three weeks following surgery during the day but you may remove then at night for sleeping. ° °MEDICATIONS °See your medication summary on the “After Visit Summary” that the nursing staff will review with you prior to discharge.  You may have some home medications which will be placed on hold until you complete the course of blood thinner medication.  It is important for you to complete the blood thinner medication as prescribed by your surgeon.  Continue your approved medications as instructed at time of  discharge. ° °PRECAUTIONS °If you experience chest pain or shortness of breath - call 911 immediately for transfer to the hospital emergency department.  °If you develop a fever greater that 101 F, purulent drainage from wound, increased redness or drainage from wound, foul odor from the wound/dressing, or calf pain - CONTACT YOUR SURGEON.   °                                                °FOLLOW-UP APPOINTMENTS °Make sure you keep all of your appointments after your operation with your surgeon and caregivers. You should call the office at the above phone number and make an appointment for approximately two weeks after the date of your surgery or on the date instructed by your surgeon outlined in the "After Visit Summary". ° ° °RANGE OF MOTION AND STRENGTHENING EXERCISES  °Rehabilitation of the knee is important following a knee injury or   an operation. After just a few days of immobilization, the muscles of the thigh which control the knee become weakened and shrink (atrophy). Knee exercises are designed to build up the tone and strength of the thigh muscles and to improve knee motion. Often times heat used for twenty to thirty minutes before working out will loosen up your tissues and help with improving the range of motion but do not use heat for the first two weeks following surgery. These exercises can be done on a training (exercise) mat, on the floor, on a table or on a bed. Use what ever works the best and is most comfortable for you Knee exercises include:  °Leg Lifts - While your knee is still immobilized in a splint or cast, you can do straight leg raises. Lift the leg to 60 degrees, hold for 3 sec, and slowly lower the leg. Repeat 10-20 times 2-3 times daily. Perform this exercise against resistance later as your knee gets better.  °Quad and Hamstring Sets - Tighten up the muscle on the front of the thigh (Quad) and hold for 5-10 sec. Repeat this 10-20 times hourly. Hamstring sets are done by pushing the  foot backward against an object and holding for 5-10 sec. Repeat as with quad sets.  °· Leg Slides: Lying on your back, slowly slide your foot toward your buttocks, bending your knee up off the floor (only go as far as is comfortable). Then slowly slide your foot back down until your leg is flat on the floor again. °· Angel Wings: Lying on your back spread your legs to the side as far apart as you can without causing discomfort.  °A rehabilitation program following serious knee injuries can speed recovery and prevent re-injury in the future due to weakened muscles. Contact your doctor or a physical therapist for more information on knee rehabilitation.  ° °IF YOU ARE TRANSFERRED TO A SKILLED REHAB FACILITY °If the patient is transferred to a skilled rehab facility following release from the hospital, a list of the current medications will be sent to the facility for the patient to continue.  When discharged from the skilled rehab facility, please have the facility set up the patient's Home Health Physical Therapy prior to being released. Also, the skilled facility will be responsible for providing the patient with their medications at time of release from the facility to include their pain medication, the muscle relaxants, and their blood thinner medication. If the patient is still at the rehab facility at time of the two week follow up appointment, the skilled rehab facility will also need to assist the patient in arranging follow up appointment in our office and any transportation needs. ° °MAKE SURE YOU:  °Understand these instructions.  °Get help right away if you are not doing well or get worse.  ° ° °Pick up stool softner and laxative for home use following surgery while on pain medications. °Do not submerge incision under water. °Please use good hand washing techniques while changing dressing each day. °May shower starting three days after surgery. °Please use a clean towel to pat the incision dry following  showers. °Continue to use ice for pain and swelling after surgery. °Do not use any lotions or creams on the incision until instructed by your surgeon. ° °Take Xarelto for two and a half more weeks, then discontinue Xarelto. °Once the patient has completed the blood thinner regimen, then take a Baby 81 mg Aspirin daily for three more weeks. ° ° °Information   on my medicine - XARELTO (Rivaroxaban)  This medication education was reviewed with me or my healthcare representative as part of my discharge preparation.  The pharmacist that spoke with me during my hospital stay was:  Luiz Ochoa [facility]  Why was Xarelto prescribed for you? Xarelto was prescribed for you to reduce the risk of blood clots forming after orthopedic surgery. The medical term for these abnormal blood clots is venous thromboembolism (VTE).  What do you need to know about xarelto ? Take your Xarelto ONCE DAILY at the same time every day. You may take it either with or without food.  If you have difficulty swallowing the tablet whole, you may crush it and mix in applesauce just prior to taking your dose.  Take Xarelto exactly as prescribed by your doctor and DO NOT stop taking Xarelto without talking to the doctor who prescribed the medication.  Stopping without other VTE prevention medication to take the place of Xarelto may increase your risk of developing a clot.  After discharge, you should have regular check-up appointments with your healthcare provider that is prescribing your Xarelto.    What do you do if you miss a dose? If you miss a dose, take it as soon as you remember on the same day then continue your regularly scheduled once daily regimen the next day. Do not take two doses of Xarelto on the same day.   Important Safety Information A possible side effect of Xarelto is bleeding. You should call your healthcare provider right away if you experience any of the following: ? Bleeding from an injury or your  nose that does not stop. ? Unusual colored urine (red or dark brown) or unusual colored stools (red or black). ? Unusual bruising for unknown reasons. ? A serious fall or if you hit your head (even if there is no bleeding).  Some medicines may interact with Xarelto and might increase your risk of bleeding while on Xarelto. To help avoid this, consult your healthcare provider or pharmacist prior to using any new prescription or non-prescription medications, including herbals, vitamins, non-steroidal anti-inflammatory drugs (NSAIDs) and supplements.  This website has more information on Xarelto: [email or web address]

## 2015-03-20 NOTE — Care Management Note (Signed)
Case Management Note  Patient Details  Name: Jeffrey Goodman MRN: [id number] Date of Birth: 05/26/1951  Subjective/Objective:                   TOTAL KNEE ARTHROPLASTY (Right) Action/Plan:  Discharge planning Expected Discharge Date:  03/20/15               Expected Discharge Plan:  [place]  In-House Referral:     Discharge planning Services  CM Consult  Post Acute Care Choice:  Home Health Choice offered to:  Patient  DME Arranged:  N/A DME Agency:  NA  HH Arranged:  PT HH Agency:  [facility]  Status of Service:  Completed, signed off  Medicare Important Message Given:    Date Medicare IM Given:    Medicare IM give by:    Date Additional Medicare IM Given:    Additional Medicare Important Message give by:     If discussed at [place] of Stay Meetings, dates discussed:    Additional Comments: CM met with pt in room to offer choice of home health agency.  [facility] [place] to render HHPT.  Address and contact information verified by pt.  Referral given to [facility] [employer] rep, Tim with request for [employer].  No DMe is needed as pt has all from previous knee surgery.  No other CM needs were communicated. Dellie Catholic, RN 03/20/2015, 10:48 AM

## 2015-03-20 NOTE — Evaluation (Signed)
Physical Therapy Evaluation Patient Details Name: Jeffrey Goodman MRN: [id number] DOB: 22-Nov-1950 Today's Date: 03/20/2015   History of Present Illness  R TKA  Clinical Impression  Pt is s/p TKA resulting in the deficits listed below (see PT Problem List). Pt ambulated 100' with RW and min/guard assist and completed R TKA exercises with min A. BP 181/94 just after exercises, pt reported 9/10 pain. Good progress expected.  Pt will benefit from skilled PT to increase their independence and safety with mobility to allow discharge to the venue listed below.      Follow Up Recommendations Home health PT    Equipment Recommendations  Other (comment) (shower seat)    Recommendations for Other Services OT consult     Precautions / Restrictions Precautions Precautions: Knee Required Braces or Orthoses: Knee Immobilizer - Right Knee Immobilizer - Right: Discontinue once straight leg raise with < 10 degree lag Restrictions Weight Bearing Restrictions: No Other Position/Activity Restrictions: WBAT      Mobility  Bed Mobility Overal bed mobility: Modified Independent             General bed mobility comments: used leg lifter, HOB up 40* (pt sleeps in recliner at home due to acid reflux)  Transfers Overall transfer level: Needs assistance Equipment used: Rolling walker (2 wheeled) Transfers: Sit to/from Stand Sit to Stand: Min guard         General transfer comment: cues for hand placement  Ambulation/Gait Ambulation/Gait assistance: Min guard Ambulation Distance (Feet): 100 Feet Assistive device: Rolling walker (2 wheeled) Gait Pattern/deviations: Step-to pattern;Decreased step length - right;Antalgic   Gait velocity interpretation: Below normal speed for age/gender General Gait Details: steady with RW, no LOB, verbal cues to lift head  Stairs            Wheelchair Mobility    Modified Rankin (Stroke Patients Only)       Balance Overall balance  assessment: Modified Independent                                           Pertinent Vitals/Pain Pain Assessment: 0-10 Pain Score: 9  Pain Location: 7/10 R knee with walking, 9/10 with knee flexion Pain Descriptors / Indicators: Sharp Pain Intervention(s): Limited activity within patient's tolerance;Ice applied;Monitored during session;Premedicated before session    Home Living Family/patient expects to be discharged to:: Private residence Living Arrangements: Other relatives Available Help at Discharge: Family;Available 24 hours/day Type of Home: House Home Access: Stairs to enter Entrance Stairs-Rails: Right Entrance Stairs-Number of Steps: 2 Home Layout: One level Home Equipment: Walker - 2 wheels;Cane - single point;Adaptive equipment;Bedside commode      Prior Function Level of Independence: Independent               Hand Dominance   Dominant Hand: Right    Extremity/Trunk Assessment   Upper Extremity Assessment: Overall WFL for tasks assessed           Lower Extremity Assessment: RLE deficits/detail RLE Deficits / Details: SLR -3/5, knee 5- 40* AAROM, ankle WNL    Cervical / Trunk Assessment: Normal  Communication   Communication: No difficulties  Cognition Arousal/Alertness: Awake/alert Behavior During Therapy: WFL for tasks assessed/performed Overall Cognitive Status: Within Functional Limits for tasks assessed                      General Comments  Exercises Total Joint Exercises Ankle Circles/Pumps: AROM;Both;10 reps;Supine Quad Sets: AROM;Both;10 reps;Supine Heel Slides: AAROM;Right;10 reps;Supine Straight Leg Raises: AAROM;Right;10 reps;Supine Goniometric ROM: 5-40* R knee AAROM      Assessment/Plan    PT Assessment Patient needs continued PT services  PT Diagnosis Difficulty walking   PT Problem List Decreased strength;Decreased range of motion;Decreased activity tolerance;Pain;Decreased mobility   PT Treatment Interventions DME instruction;Gait training;Stair training;Functional mobility training;Therapeutic activities;Patient/family education;Therapeutic exercise   PT Goals (Current goals can be found in the Care Plan section) Acute Rehab PT Goals Patient Stated Goal: return to work as Pharmacist, hospital at [employer] PT Goal Formulation: With patient/family Time For Goal Achievement: 04/03/15 Potential to Achieve Goals: Good    Frequency 7X/week   Barriers to discharge        Co-evaluation               End of Session Equipment Utilized During Treatment: Gait belt Activity Tolerance: Patient tolerated treatment well Patient left: in chair;with call bell/phone within reach;with family/visitor present Nurse Communication: Mobility status         Time: 1003-1035 PT Time Calculation (min) (ACUTE ONLY): 32 min   Charges:   PT Evaluation $Initial PT Evaluation Tier I: 1 Procedure PT Treatments $Gait Training: 8-22 mins   PT G Codes:        Philomena Doheny 03/20/2015, 10:44 AM [phone]

## 2015-03-21 LAB — CBC
HEMATOCRIT: 36.9 % — AB (ref 39.0–52.0)
HEMOGLOBIN: 12.9 g/dL — AB (ref 13.0–17.0)
MCH: 33.3 pg (ref 26.0–34.0)
MCHC: 35 g/dL (ref 30.0–36.0)
MCV: 95.3 fL (ref 78.0–100.0)
Platelets: 199 10*3/uL (ref 150–400)
RBC: 3.87 MIL/uL — AB (ref 4.22–5.81)
RDW: 12.5 % (ref 11.5–15.5)
WBC: 17.2 10*3/uL — AB (ref 4.0–10.5)

## 2015-03-21 LAB — BASIC METABOLIC PANEL
ANION GAP: 7 (ref 5–15)
BUN: 17 mg/dL (ref 6–20)
CO2: 27 mmol/L (ref 22–32)
Calcium: 8.7 mg/dL — ABNORMAL LOW (ref 8.9–10.3)
Chloride: 101 mmol/L (ref 101–111)
Creatinine, Ser: 0.8 mg/dL (ref 0.61–1.24)
GFR calc non Af Amer: >60 mL/min (ref >60)
GLUCOSE: 179 mg/dL — AB (ref 65–99)
Potassium: 4.1 mmol/L (ref 3.5–5.1)
Sodium: 135 mmol/L (ref 135–145)

## 2015-03-21 MED ORDER — OXYCODONE HCL 5 MG PO TABS: 5.0000 mg | ORAL_TABLET | ORAL | Status: DC | PRN

## 2015-03-21 MED ORDER — RIVAROXABAN 10 MG PO TABS: 10.0000 mg | ORAL_TABLET | Freq: Every day | ORAL | Status: DC

## 2015-03-21 MED ORDER — CYCLOBENZAPRINE HCL 10 MG PO TABS: 10.0000 mg | ORAL_TABLET | Freq: Three times a day (TID) | ORAL | Status: DC | PRN

## 2015-03-21 MED ORDER — TRAMADOL HCL 50 MG PO TABS: 50.0000 mg | ORAL_TABLET | Freq: Four times a day (QID) | ORAL | Status: DC | PRN

## 2015-03-21 NOTE — Progress Notes (Signed)
Occupational Therapy Treatment Patient Details Name: Jeffrey Goodman MRN: [id number] DOB: 1951/07/05 Today's Date: 03/21/2015    History of present illness R TKA   OT comments  Pt doing well and for d/c today. Practiced tub bench transfer and educated further on AE options. Family will be able to assist at d/c also.   Follow Up Recommendations  No OT follow up;Supervision/Assistance - 24 hour    Equipment Recommendations  None recommended by OT    Recommendations for Other Services      Precautions / Restrictions Precautions Precautions: Knee Required Braces or Orthoses: Knee Immobilizer - Right Knee Immobilizer - Right: Discontinue once straight leg raise with < 10 degree lag Restrictions Weight Bearing Restrictions: No Other Position/Activity Restrictions: WBAT       Mobility Bed Mobility Overal bed mobility: Modified Independent             General bed mobility comments: pt used leg lifter for OOB  Transfers Overall transfer level: Needs assistance Equipment used: Rolling walker (2 wheeled) Transfers: Sit to/from Stand Sit to Stand: Min guard         General transfer comment: cues for hand placement. Pt had both hands on walker.     Balance                                   ADL                                   Tub/ Shower Transfer: Min guard;Tub bench;Rolling walker     General ADL Comments: Practiced tub bench transfer and pt used leg lifter to lift R LE over the tub. Educated on how to adjust tub bench to the appropriate height and wearing KI until he sits down on bench and then doffing for shower and then don KI again before standing from bench until he is able to SLR. Pt did very well with tubbench transfer. Reviewed AE use and exchanged out long reacher for extra long reacher as pt's family member purchased AE kit. Demontrated sock aid, reacher for donning shorts/underwear and doffing socks and LHS. Explained long  shoe horn use. Pt declined needing to practice 3in1 transfer again.       Vision                     Perception     Praxis      Cognition   Behavior During Therapy: WFL for tasks assessed/performed Overall Cognitive Status: Within Functional Limits for tasks assessed                       Extremity/Trunk Assessment               Exercises     Shoulder Instructions       General Comments      Pertinent Vitals/ Pain       Pain Assessment: 0-10 Pain Score: 6  Pain Location: R knee Pain Descriptors / Indicators: Aching Pain Intervention(s): Repositioned;Ice applied  Home Living                                          Prior Functioning/Environment  Frequency Min 2X/week     Progress Toward Goals  OT Goals(current goals can now be found in the care plan section)  Progress towards OT goals: Progressing toward goals     Plan Discharge plan remains appropriate    Co-evaluation                 End of Session Equipment Utilized During Treatment: Gait belt;Rolling walker;Right knee immobilizer   Activity Tolerance Patient tolerated treatment well   Patient Left in chair;with call bell/phone within reach   Nurse Communication          Time: 1660-6004 OT Time Calculation (min): 32 min  Charges: OT General Charges $OT Visit: 1 Procedure OT Treatments $Self Care/Home Management : 8-22 mins $Therapeutic Activity: 8-22 mins  Jules Schick  [phone] 03/21/2015, 10:00 AM

## 2015-03-21 NOTE — Progress Notes (Signed)
Physical Therapy Treatment Patient Details Name: Jeffrey Goodman MRN: [id number] DOB: November 22, 1950 Today's Date: 03/21/2015    History of Present Illness R TKA    PT Comments    Pt progressing well with mobility. Stair training completed. REviewed HEP. REady to DC from PT standpoint.    Follow Up Recommendations  Home health PT     Equipment Recommendations  None recommended by PT    Recommendations for Other Services       Precautions / Restrictions Precautions Precautions: Knee Required Braces or Orthoses: Knee Immobilizer - Right Knee Immobilizer - Right: Discontinue once straight leg raise with < 10 degree lag Restrictions Weight Bearing Restrictions: No Other Position/Activity Restrictions: WBAT    Mobility  Bed Mobility Overal bed mobility: Modified Independent             General bed mobility comments: NT -up in chair  Transfers Overall transfer level: Needs assistance Equipment used: Rolling walker (2 wheeled) Transfers: Sit to/from Stand Sit to Stand: Supervision         General transfer comment: cues for hand placement and LE management  Ambulation/Gait   Ambulation Distance (Feet): 270 Feet Assistive device: Rolling walker (2 wheeled) Gait Pattern/deviations: Step-to pattern;Antalgic   Gait velocity interpretation: Below normal speed for age/gender General Gait Details: steady with RW, no LOB, verbal cues to lift head   Stairs Stairs: Yes Stairs assistance: Min guard Stair Management: One rail Left;Step to pattern;Forwards Number of Stairs: 3 General stair comments: instructed pt in forwards with one rail/and cane, and backwards with RW  Wheelchair Mobility    Modified Rankin (Stroke Patients Only)       Balance                                    Cognition Arousal/Alertness: Awake/alert Behavior During Therapy: WFL for tasks assessed/performed Overall Cognitive Status: Within Functional Limits for tasks  assessed                      Exercises Total Joint Exercises Ankle Circles/Pumps: AROM;Both;10 reps;Supine Quad Sets: AROM;Both;10 reps;Supine Short Arc Quad: AAROM;Right;10 reps;Supine Heel Slides: AAROM;Right;10 reps;Supine Hip ABduction/ADduction: Right;AAROM;10 reps;Supine Straight Leg Raises: AAROM;Right;10 reps;Supine Goniometric ROM: 5-45* R knee AAROM    General Comments        Pertinent Vitals/Pain Pain Assessment: 0-10 Pain Score: 8  Pain Location: R knee with walking Pain Descriptors / Indicators: Sore Pain Intervention(s): Limited activity within patient's tolerance;Ice applied;Monitored during session;Premedicated before session    Home Living                      Prior Function            PT Goals (current goals can now be found in the care plan section) Acute Rehab PT Goals Patient Stated Goal: return to work as Pharmacist, hospital at [employer] PT Goal Formulation: With patient/family Time For Goal Achievement: 04/03/15 Potential to Achieve Goals: Good Progress towards PT goals: Progressing toward goals    Frequency  7X/week    PT Plan Current plan remains appropriate    Co-evaluation             End of Session Equipment Utilized During Treatment: Right knee immobilizer Activity Tolerance: Patient tolerated treatment well Patient left: with call bell/phone within reach;in chair     Time: 1914-7829 PT Time Calculation (min) (ACUTE ONLY): 42  min  Charges:  $Gait Training: 23-37 mins $Therapeutic Exercise: 8-22 mins                    G Codes:      Jeffrey Goodman 03/21/2015, 10:12 AM [phone]

## 2015-03-21 NOTE — Progress Notes (Signed)
   Subjective: 2 Days Post-Op Procedure(s) (LRB): TOTAL KNEE ARTHROPLASTY (Right) Patient reports pain as mild.   Patient seen in rounds with Dr. Wynelle Link. Patient is well, and has had no acute complaints or problems Patient is ready to go home  Objective: Vital signs in last 24 hours: Temp:  [98.5 F (36.9 C)-98.7 F (37.1 C)] 98.6 F (37 C) (07/13 0430) Pulse Rate:  [61-74] 67 (07/13 0430) Resp:  [16-18] 18 (07/13 0430) BP: (160-181)/(86-97) 164/86 mmHg (07/13 0430) SpO2:  [96 %-98 %] 97 % (07/13 0430)  Intake/Output from previous day:  Intake/Output Summary (Last 24 hours) at 03/21/15 0725 Last data filed at 03/21/15 0350  Gross per 24 hour  Intake 616.67 ml  Output   2700 ml  Net -2083.33 ml     Labs:  Recent Labs  03/20/15 0418 03/21/15 0455  HGB 14.4 12.9*    Recent Labs  03/20/15 0418 03/21/15 0455  WBC 16.7* 17.2*  RBC 4.43 3.87*  HCT 41.3 36.9*  PLT 185 199    Recent Labs  03/20/15 0418 03/21/15 0455  NA 133* 135  K 3.9 4.1  CL 100* 101  CO2 25 27  BUN 12 17  CREATININE 0.82 0.80  GLUCOSE 167* 179*  CALCIUM 8.6* 8.7*   No results for input(s): LABPT, INR in the last 72 hours.  EXAM: General - Patient is Alert, Appropriate and Oriented Extremity - Neurovascular intact Sensation intact distally Dorsiflexion/Plantar flexion intact Incision - clean, dry, no drainage Motor Function - intact, moving foot and toes well on exam.   Assessment/Plan: 2 Days Post-Op Procedure(s) (LRB): TOTAL KNEE ARTHROPLASTY (Right) Procedure(s) (LRB): TOTAL KNEE ARTHROPLASTY (Right) Past Medical History  Diagnosis Date  . Hypertension   . Kidney stones   . GERD (gastroesophageal reflux disease)   . Arthritis     OA BOTH KNEES  . Sinusitis, chronic   . Diverticulosis   . Hemorrhoids    Principal Problem:   OA (osteoarthritis) of knee  Estimated body mass index is 32.65 kg/(m^2) as calculated from the following:   Height as of this encounter: 5'  11" (1.803 m).   Weight as of this encounter: 106.142 kg (234 lb). Up with therapy Discharge home with home health Diet - Cardiac diet Follow up - in 2 weeks Activity - WBAT Disposition - Home Condition Upon Discharge - Good D/C Meds - See DC Summary DVT Prophylaxis - Xarelto  Arlee Muslim, PA-C Orthopaedic Surgery 03/21/2015, 7:25 AM

## 2016-06-23 HISTORY — PX: PROSTATE BIOPSY: SHX241

## 2016-06-26 ENCOUNTER — Other Ambulatory Visit: Payer: Self-pay | Admitting: Urology

## 2016-06-26 DIAGNOSIS — C61 Malignant neoplasm of prostate: Secondary | ICD-10-CM

## 2016-07-04 ENCOUNTER — Encounter (HOSPITAL_COMMUNITY)
Admission: RE | Admit: 2016-07-04 | Discharge: 2016-07-04 | Disposition: A | Discharge status: Home / Self Care | Payer: BLUE CROSS/BLUE SHIELD | Source: Ambulatory Visit | Attending: Urology | Admitting: Urology

## 2016-07-04 DIAGNOSIS — C61 Malignant neoplasm of prostate: Secondary | ICD-10-CM | POA: Insufficient documentation

## 2016-07-04 MED ORDER — TECHNETIUM TC 99M MEDRONATE IV KIT
19.5000 | PACK | Freq: Once | INTRAVENOUS | Status: AC | PRN
  Administered 2016-07-04: 19.5 via INTRAVENOUS

## 2016-08-11 ENCOUNTER — Encounter: Payer: Self-pay | Admitting: Medical Oncology

## 2016-08-11 ENCOUNTER — Telehealth: Payer: Self-pay | Admitting: Medical Oncology

## 2016-08-11 NOTE — Telephone Encounter (Signed)
I called pt to introduce myself as the Prostate Nurse Navigator and the Coordinator of the Prostate [place].  1. I confirmed with the patient he is aware of his referral to the clinic 08/15/16 arriving at 7:45 am.  2. I discussed the format of the clinic and the physicians he will be seeing that day.  3. I discussed where the clinic is located and how to contact me.  4. I confirmed his address and informed him I would be mailing a packet of information and forms to be completed. I asked him to bring them with him the day of his appointment.   He voiced understanding of the above. I asked him to call me if he has any questions or concerns regarding his appointments or the forms he needs to complete.

## 2016-08-13 ENCOUNTER — Encounter: Payer: Self-pay | Admitting: Radiation Oncology

## 2016-08-13 NOTE — Progress Notes (Signed)
GU Location of Tumor / Histology: prostatic adenocarcinoma  If Prostate Cancer, Gleason Score is (4 + 5 ) and PSA is (13.49) on 05/2016  Claudette Head hx of kidney stones. Returned to Dr. Jeffie Pollock 05/26/16 for further evaluation of elevated PSA.  Biopsies of prostate (if applicable) revealed:    Past/Anticipated interventions by urology, if any: biopsy, referral to [facility]  Past/Anticipated interventions by medical oncology, if any: no  Weight changes, if any: no  Bowel/Bladder complaints, if any: urinary intermittency, nocturia x 2  Nausea/Vomiting, if any: no  Pain issues, if any:  Low back pain which he relates to caregiver activities  SAFETY ISSUES:  Prior radiation? no  Pacemaker/ICD? no  Possible current pregnancy? no  Is the patient on methotrexate? no  Current Complaints / other details:  65 year old male.

## 2016-08-14 ENCOUNTER — Telehealth: Payer: Self-pay | Admitting: Medical Oncology

## 2016-08-14 NOTE — Telephone Encounter (Signed)
Called to confirm appointment for Prostate [facility] 08/14/16 arriving at 7:45am.

## 2016-08-15 ENCOUNTER — Ambulatory Visit
Admission: RE | Admit: 2016-08-15 | Discharge: 2016-08-15 | Disposition: A | Discharge status: Home / Self Care | Payer: BLUE CROSS/BLUE SHIELD | Source: Ambulatory Visit | Attending: Radiation Oncology | Admitting: Radiation Oncology

## 2016-08-15 ENCOUNTER — Encounter: Payer: Self-pay | Admitting: Medical Oncology

## 2016-08-15 ENCOUNTER — Ambulatory Visit (HOSPITAL_BASED_OUTPATIENT_CLINIC_OR_DEPARTMENT_OTHER): Payer: BLUE CROSS/BLUE SHIELD | Admitting: Oncology

## 2016-08-15 VITALS — BP 147/96 | HR 64 | Resp 18 | Ht 71.0 in | Wt 233.0 lb

## 2016-08-15 DIAGNOSIS — I10 Essential (primary) hypertension: Secondary | ICD-10-CM | POA: Diagnosis not present

## 2016-08-15 DIAGNOSIS — C61 Malignant neoplasm of prostate: Secondary | ICD-10-CM | POA: Diagnosis present

## 2016-08-15 HISTORY — DX: Malignant neoplasm of prostate: C61

## 2016-08-15 NOTE — Progress Notes (Signed)
                               Care Plan Summary  Name: Jeffrey Goodman, Jeffrey Goodman DOB: 11/27/1950   Your Medical Team:   Urologist -  Dr. Raynelle Bring, [facility]  Radiation Oncologist - Dr. Tyler Pita, [facility]   Medical Oncologist - Dr. Zola Button, [place]  Recommendations: 1)Androgen Deprivation (hormone therapy) 2 years  2) Radiation Therapy with seed boost   * These recommendations are based on information available as of today's consult.      Recommendations may change depending on the results of further tests or exams.  Next Steps: 1) Dr. Ralene Muskrat office will schedule hormone injection 2) Dr. Johny Shears office will schedule CT simulation and treatments.   When appointments need to be scheduled, you will be contacted by [facility] and/or [facility].  Questions?  Please do not hesitate to call Cira Rue, RN, BSN, OCN at [phone] any questions or concerns.  Shirlean Mylar is your Oncology Nurse Navigator and is available to assist you while you're receiving your medical care at [facility].

## 2016-08-15 NOTE — Progress Notes (Signed)
Radiation Oncology         [phone] ________________________________  Multidisciplinary Prostate Cancer Clinic  Initial Radiation Oncology Consultation  Name: Jeffrey Goodman MRN: [id number]  Date: 08/15/2016  DOB: 06/22/1951  [id number] A, MD  Raynelle Bring, MD   REFERRING PHYSICIAN: Raynelle Bring, MD  DIAGNOSIS: 65 y.o. 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 Malignant neoplasm of prostate ([place]) Jeffrey Goodman is a 65 y.o. gentleman.  He was noted to have an elevated PSA of 13.79 by Dr. Shelia Media.  Accordingly, he was referred back for evaluation in urology by Dr. Jeffie Pollock on 05/26/16,  digital rectal examination was performed at that time revealing a right mid gland 1.2 cm prostate nodule.  The patient proceeded to transrectal ultrasound with 12 biopsies of the prostate on 06/23/16.  The prostate volume measured 31.17 cc.  Out of 12 core biopsies, 4 were positive.  The maximum Gleason score was 4+5=9, and this was seen in the right base lateral. 4+4 seen in the right mid and right base. 3+4 in the right mid lateral. Bone scan and CT scan of the pelvis on 07/04/16 were negative.  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 He comes to discuss options of therapy.  PREVIOUS RADIATION THERAPY: No  PAST MEDICAL HISTORY:  Past Medical History:  Diagnosis Date  . Arthritis    OA BOTH KNEES  . Diverticulosis   . GERD (gastroesophageal reflux disease)   . Hemorrhoids   . Hypertension   . Kidney stones   . Prostate cancer ([place])   . Sinusitis, chronic      PAST SURGICAL HISTORY: Past Surgical History:  Procedure Laterality Date  . LITHOTRIPSY     X 2  . PROSTATE BIOPSY    . TOTAL KNEE ARTHROPLASTY Left 11/08/2012   Procedure: TOTAL KNEE  ARTHROPLASTY;  Surgeon: [name], MD;  Location: [facility];  Service: Orthopedics;  Laterality: Left;  . TOTAL KNEE ARTHROPLASTY Right 03/19/2015   Procedure: TOTAL KNEE ARTHROPLASTY;  Surgeon: Gaynelle Arabian, MD;  Location: [facility];  Service: Orthopedics;  Laterality: Right;    FAMILY HISTORY: No family history of malignancy  SOCIAL HISTORY:  reports that he has never smoked. He has never used smokeless tobacco. He reports that he drinks alcohol. He reports that he does not use drugs. The patient lives in [place], and is retired from Armed forces technical officer in a [employer].   ALLERGIES: Patient has no known allergies.  MEDICATIONS:  Current Outpatient Prescriptions  Medication Sig Dispense Refill  . acetaminophen (TYLENOL) 500 MG tablet Take 1,000 mg by mouth every 6 (six) hours as needed for pain.    . cyclobenzaprine (FLEXERIL) 10 MG tablet Take 1 tablet (10 mg total) by mouth 3 (three) times daily as needed for muscle spasms. 80 tablet 0  . diltiazem (CARDIZEM CD) 240 MG 24 hr capsule Take 480 mg by mouth every morning.     . diphenhydrAMINE (BENADRYL) 25 mg capsule Take 50 mg by mouth every 6 (six) hours as needed for itching or allergies.     Marland Kitchen guanFACINE (TENEX) 2 MG tablet Take 2 mg by mouth every morning.     . hydrochlorothiazide (HYDRODIURIL) 25 MG tablet Take 25 mg by mouth  every morning.     . metoprolol tartrate (LOPRESSOR) 25 MG tablet Take 50 mg by mouth daily.     Marland Kitchen oxyCODONE (OXY IR/ROXICODONE) 5 MG immediate release tablet Take 1-3 tablets (5-15 mg total) by mouth every 3 (three) hours as needed for moderate pain or severe pain. 90 tablet 0  . oxymetazoline (AFRIN) 0.05 % nasal spray Place 1 spray into the nose 2 (two) times daily.     . rivaroxaban (XARELTO) 10 MG TABS tablet Take 1 tablet (10 mg total) by mouth daily with breakfast. Take Xarelto for two and a half more weeks, then discontinue Xarelto. Once the patient has completed the blood thinner regimen, then take a  Baby 81 mg Aspirin daily for three more weeks. 19 tablet 0  . traMADol (ULTRAM) 50 MG tablet Take 1-2 tablets (50-100 mg total) by mouth every 6 (six) hours as needed (mild pain). 60 tablet 1   No current facility-administered medications for this encounter.     REVIEW OF SYSTEMS:  On review of systems, the patient reports that he is doing well overall. He denies any chest pain, shortness of breath, cough, fevers, chills, night sweats, unintended weight changes. He denies any bowel disturbances, and denies abdominal pain, nausea or vomiting. He denies any new musculoskeletal or joint aches or pains. The patient completed an IPSS and IIEF questionnaire.  His IPSS score was 9 indicating moderate urinary outflow obstructive symptoms.  He indicated that his erectile function is intact. He reports lower back pain that he attributes to caregiver activities. A complete review of systems is obtained and is otherwise negative.    PHYSICAL EXAM:   height is 5\' 11"  (1.803 m) and weight is 233 lb (105.7 kg). His blood pressure is 147/96 (abnormal) and his pulse is 64. His respiration is 18 and oxygen saturation is 100%.   In general this is a well appearing caucasian male in no acute distress. He is alert and oriented x4 and appropriate throughout the examination. HEENT reveals that the patient is normocephalic, atraumatic. EOMs are intact. PERRLA. Skin is intact without any evidence of gross lesions. Cardiovascular exam reveals a regular rate and rhythm, no clicks rubs or murmurs are auscultated. Chest is clear to auscultation bilaterally. Lymphatic assessment is performed and does not reveal any adenopathy in the cervical, supraclavicular, axillary, chains.  KPS = 100  100 - Normal; no complaints; no evidence of disease. 90   - Able to carry on normal activity; minor signs or symptoms of disease. 80   - Normal activity with effort; some signs or symptoms of disease. 64   - Cares for self; unable to carry on  normal activity or to do active work. 60   - Requires occasional assistance, but is able to care for most of his personal needs. 50   - Requires considerable assistance and frequent medical care. 39   - Disabled; requires special care and assistance. 48   - Severely disabled; hospital admission is indicated although death not imminent. 63   - Very sick; hospital admission necessary; active supportive treatment necessary. 10   - Moribund; fatal processes progressing rapidly. 0     - Dead  Karnofsky DA, Abelmann WH, Craver LS and Burchenal [facility] [phone]) The use of the nitrogen mustards in the palliative treatment of carcinoma: with particular reference to bronchogenic carcinoma Cancer 1 634-56   LABORATORY DATA:  Lab Results  Component Value Date   WBC 17.2 (H) 03/21/2015   HGB 12.9 (L)  03/21/2015   HCT 36.9 (L) 03/21/2015   MCV 95.3 03/21/2015   PLT 199 03/21/2015   Lab Results  Component Value Date   NA 135 03/21/2015   K 4.1 03/21/2015   CL 101 03/21/2015   CO2 27 03/21/2015   Lab Results  Component Value Date   ALT 34 03/14/2015   AST 30 03/14/2015   ALKPHOS 84 03/14/2015   BILITOT 0.9 03/14/2015     RADIOGRAPHY: No results found.    IMPRESSION/PLAN:  1. 65 y.o. 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  We discussed the natural history of prostate cancer.  We reviewed the the implications of T-stage, Gleason's Score, and PSA on decision-making and outcomes in prostate cancer.  We discussed radiation treatment in the management of prostate cancer with regard to the logistics and delivery of radiation.  His T-Stage, Gleason's Score, and PSA put him into the high risk group.  Accordingly he is eligible for a variety of potential treatment options including prostatectomy with right wide resection and extended node dissection or external beam radiation for 8 weeks with 2 years of ADT or external beam radiation versus 5  weeks of radiation with a seed boost and 2 years of ADT. After consideration, the patient is most likely going to proceed with prostate IMRT and 2 years of ADT. We will share this with Dr. Alinda Money and move forward with scheduling placement of three gold fiducial markers into the prostate to proceed with IMRT in the near future.  The above documentation reflects my direct findings during this shared patient visit. Please see the separate note by Dr. Tammi Klippel on this date for the remainder of the patient's plan of care.     Carola Rhine, PAC   This document serves as a record of services personally performed by Jeffrey Simpson, PA-C and Tyler Pita, MD. It was created on their behalf by Darcus Austin, a trained medical scribe. The creation of this record is based on the scribe's personal observations and the providers' statements to them. This document has been checked and approved by the attending provider.

## 2016-08-15 NOTE — Progress Notes (Signed)
Reason for Referral: Prostate cancer.   HPI: 65 year old currently of [place] where he lived the majority of his life. He is a gentleman in reasonable health and shape improves started developing a rise in his PSA up to 13. He was evaluated by Dr. Jeffie Pollock and his rectal examination showed a palpable nodule. He subsequently underwent a biopsy on 06/23/2016 which showed a high-grade past 8 cancer with a Gleason score 4+5 = 9 with a pattern of 4+4 = 8 also noted. He had total of 4 cores of prostate involved. He had very little symptoms except for nocturia associated with the amount of fluid he takes at nighttime. He does not have any hematuria or dysuria. He remains reasonably active with good performance status. 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  He does not report any headaches, blurry vision, syncope or seizures. He does not report any fevers or chills or sweats. He does not report any cough, wheezing or hemoptysis. He does not report any nausea, vomiting or abdominal pain. He does not report any skeletal complaints of arthralgias or myalgias. Remaining review of systems unremarkable.   Past Medical History:  Diagnosis Date  . Arthritis    OA BOTH KNEES  . Diverticulosis   . GERD (gastroesophageal reflux disease)   . Hemorrhoids   . Hypertension   . Kidney stones   . Prostate cancer ([place])   . Sinusitis, chronic   :  Past Surgical History:  Procedure Laterality Date  . LITHOTRIPSY     X 2  . PROSTATE BIOPSY    . TOTAL KNEE ARTHROPLASTY Left 11/08/2012   Procedure: TOTAL KNEE ARTHROPLASTY;  Surgeon: [name], MD;  Location: [facility];  Service: Orthopedics;  Laterality: Left;  . TOTAL KNEE ARTHROPLASTY Right 03/19/2015   Procedure: TOTAL KNEE ARTHROPLASTY;  Surgeon: Gaynelle Arabian, MD;  Location: [facility];  Service: Orthopedics;  Laterality: Right;  :   Current Outpatient Prescriptions:  .  acetaminophen  (TYLENOL) 500 MG tablet, Take 1,000 mg by mouth every 6 (six) hours as needed for pain., Disp: , Rfl:  .  cyclobenzaprine (FLEXERIL) 10 MG tablet, Take 1 tablet (10 mg total) by mouth 3 (three) times daily as needed for muscle spasms., Disp: 80 tablet, Rfl: 0 .  diltiazem (CARDIZEM CD) 240 MG 24 hr capsule, Take 480 mg by mouth every morning. , Disp: , Rfl:  .  diphenhydrAMINE (BENADRYL) 25 mg capsule, Take 50 mg by mouth every 6 (six) hours as needed for itching or allergies. , Disp: , Rfl:  .  guanFACINE (TENEX) 2 MG tablet, Take 2 mg by mouth every morning. , Disp: , Rfl:  .  hydrochlorothiazide (HYDRODIURIL) 25 MG tablet, Take 25 mg by mouth every morning. , Disp: , Rfl:  .  metoprolol tartrate (LOPRESSOR) 25 MG tablet, Take 50 mg by mouth daily. , Disp: , Rfl:  .  oxyCODONE (OXY IR/ROXICODONE) 5 MG immediate release tablet, Take 1-3 tablets (5-15 mg total) by mouth every 3 (three) hours as needed for moderate pain or severe pain., Disp: 90 tablet, Rfl: 0 .  oxymetazoline (AFRIN) 0.05 % nasal spray, Place 1 spray into the nose 2 (two) times daily. , Disp: , Rfl:  .  rivaroxaban (XARELTO) 10 MG TABS tablet, Take 1 tablet (10 mg total) by mouth daily with breakfast. Take Xarelto for two and a half more weeks, then discontinue Xarelto. Once the patient has  completed the blood thinner regimen, then take a Baby 81 mg Aspirin daily for three more weeks., Disp: 19 tablet, Rfl: 0 .  traMADol (ULTRAM) 50 MG tablet, Take 1-2 tablets (50-100 mg total) by mouth every 6 (six) hours as needed (mild pain)., Disp: 60 tablet, Rfl: 1:  No Known Allergies:  No family history on file.:  Social History   Social History  . Marital status: Divorced    Spouse name: N/A  . Number of children: N/A  . Years of education: N/A   Occupational History  . Not on file.   Social History Main Topics  . Smoking status: Never Smoker  . Smokeless tobacco: Never Used  . Alcohol use 0.0 oz/week     Comment: 1 GLASS WINE  DAILY  . Drug use: No  . Sexual activity: Not Currently    Birth control/ protection: Condom     Comment: no interest in sexual activity at this time.   Other Topics Concern  . Not on file   Social History Narrative  . No narrative on file  :  Pertinent items are noted in HPI.  Exam: ECOG 0.  General appearance: alert and cooperative appeared without distress. Head: Normocephalic, without obvious abnormality Throat: lips, mucosa, and tongue normal; teeth and gums normal Neck: no adenopathy Back: negative Resp: clear to auscultation bilaterally no dullness to percussion. Cardio: regular rate and rhythm, S1, S2 normal, no murmur, click, rub or gallop GI: soft, non-tender; bowel sounds normal; no masses,  no organomegaly Extremities: extremities normal, atraumatic, no cyanosis or edema Lymph nodes: Cervical, supraclavicular, and axillary nodes normal.    CBC    Component Value Date/Time   WBC 17.2 (H) 03/21/2015 0455   RBC 3.87 (L) 03/21/2015 0455   HGB 12.9 (L) 03/21/2015 0455   HCT 36.9 (L) 03/21/2015 0455   PLT 199 03/21/2015 0455   MCV 95.3 03/21/2015 0455   MCH 33.3 03/21/2015 0455   MCHC 35.0 03/21/2015 0455   RDW 12.5 03/21/2015 0455   CMP Latest Ref Rng & Units 03/21/2015 03/20/2015 03/14/2015  Glucose 65 - 99 mg/dL 179(H) 167(H) 119(H)  BUN 6 - 20 mg/dL 17 12 21(H)  Creatinine 0.61 - 1.24 mg/dL 0.80 0.82 1.09  Sodium 135 - 145 mmol/L 135 133(L) 138  Potassium 3.5 - 5.1 mmol/L 4.1 3.9 4.4  Chloride 101 - 111 mmol/L 101 100(L) 101  CO2 22 - 32 mmol/L 27 25 30   Calcium 8.9 - 10.3 mg/dL 8.7(L) 8.6(L) 9.6  Total Protein 6.5 - 8.1 g/dL - - 7.8  Total Bilirubin 0.3 - 1.2 mg/dL - - 0.9  Alkaline Phos 38 - 126 U/L - - 84  AST 15 - 41 U/L - - 30  ALT 17 - 63 U/L - - 34     Assessment and Plan:   65 year old gentleman with prostate cancer diagnosed in October 2017. PSA was 13.79 and his prostate biopsy showed a Gleason score 4+5 = 9 in one core and 2 other cores of  4+4 = 8. His final pathological staging is T2 1 N0 M0. His case was discussed today in the prostate cancer multidisciplinary clinic. His pathology was discussed with the reviewing pathologist and his imaging studies were reviewed. CT scan and bone scan were reviewed with radiology which showed no evidence of metastatic disease.  The natural course of this disease was discussed with the patient and curative options to exist in this particular setting constituting either surgery versus radiation therapy with ADT. He  is in favor proceeding with radiation therapy as a primary definitive modality. Complications associated with chemotherapy were reviewed today including hot flashes, weight gain, fatigue and possible osteoporosis. He is agreeable to proceed with definitive radiation therapy and likely will require 24 months of androgen deprivation.  All his questions are answered today to his satisfaction.

## 2016-08-15 NOTE — Consult Note (Signed)
Multi-Disciplinary Clinic     08/15/2016   --------------------------------------------------------------------------------   Jeffrey Goodman. Jeffrey Goodman  MRN: [id number]  PRIMARY CARE:  Stephens Shire, MD  DOB: [birth year], 65 year old Male  REFERRING:    SSN: [id number]  PROVIDER:  Irine Seal, M.D.    TREATING:  Raynelle Bring, M.D.    LOCATION:  [facility] [phone]   --------------------------------------------------------------------------------   CC/HPI: CC: Prostate Cancer   Physician requesting consult: Dr. Irine Seal  PCP: Dr. Juanita Craver  Location of consult: [facility] - Prostate Cancer Multidisciplinary Clinic   Mr. Jeffrey Goodman is a 65 year old gentleman who presented to Dr. Jeffie Pollock with a PSA of 13.79 and right mid prostate nodule. He underwent a TRUS biopsy of the prostate on 06/23/16 confirming Gleason 4+5=9 adenocarcinoma of the prostate with 4 out of 12 biopsy cores positive for malignancy.   Family history: None.   Imaging studies: CT of the pelvis (07/04/16) - negative for metastatic disease, Bone scan (07/04/16) - negative for metastatic disease (there are some changes/uptake of tracer the right humeral head although this felt to be degenerative or traumatic rather than indicative of metastatic disease)   PMH: He has a history of hypertension and urolithiasis.  PSH: No abdominal surgery. TKA (revised last in 2014)   TNM stage: cT2a N0 M0  PSA: 13.79  Gleason score: 4+5=9  Biopsy (06/23/16): 4/12 cores positive  Left: Benign  Right: R mid (70%, 4+4=8), R lateral mid (50%, 3+4=7), R base (60%, 4+4=8), R lateral base (50%, 4+5=9)  Prostate volume: 31.2 cc   Nomogram  OC disease: 17%  EPE: 79%  SVI: 23%  LNI: 28%  PFS (5 year, 10 year): 28%,17%   Urinary function: IPSS is 9.  Erectile function: SHIM score is 20.     ALLERGIES: No Allergies    MEDICATIONS: Benadryl TABS Oral  DilTIAZem HCl ER Coated Beads 240 MG Oral Capsule  Extended Release 24 Hour Oral  Guanfacine Hcl 1 mg tablet  HydroCHLOROthiazide 25 MG Oral Tablet Oral  Metoprolol Tartrate 25 MG Oral Tablet Oral  Multi-Vitamin TABS Oral     GU PSH: Locm 300-399Mg /Ml Iodine,1Ml - 07/04/2016 Prostate Needle Biopsy - 06/23/2016 Renal ESWL - 2013, 2012, 2008      [place] Notes: Knee Replacement, Lithotripsy, Lithotripsy, Lithotripsy   NON-GU PSH: Revise Knee Joint - 2014 Surgical Pathology, Gross And Microscopic Examination For Prostate Needle - 06/23/2016    GU PMH: Prostate Cancer, He has a T2a N0 M0 Gleason 9 prostate cancer. - 07/23/2016 Elevated PSA - 06/23/2016, He has a marked elevation of the PSA but the last I had was from 2008. , - 05/26/2016 Nocturia, He has nocturia and intermittency. - 05/26/2016 Nodular prostate w/ LUTS, He has a right prostate nodule but no extracapsular gross disease. - 05/26/2016 Kidney Stone, Bilateral kidney stones - 09/20/2015, Kidney stone on right side, - 2015 Abdominal Pain Unspec, Rt flank pain - 2015 Calculus Ureter, Calculus of right ureter - 2015, Proximal Ureteral Stone On The Left, - 2014, Ureteral Stone, - 2014 Flank Pain, Diffuse Abdominal Pain - 2014      PMH Notes:  2007-07-29 14:49:23 - Note: Nephrolithiasis Of The Right Kidney  2011-07-14 10:41:06 - Note: Arthritis   NON-GU PMH: Encounter for general adult medical examination without abnormal findings, Encounter for preventive health examination - 2015 Personal history of other diseases of the circulatory system, History of hypertension - 2014 Unspecified disorder of calcium metabolism, Hypercalciuria -  2014    FAMILY HISTORY: Diabetes - Father Family Health Status Number - Runs In Family Hypertension - Father, Mother nephrolithiasis - Runs In Family Stroke Syndrome - Mother   SOCIAL HISTORY: None    Notes: Occupation:, Caffeine Use, Marital History - Single, Never A Smoker, Tobacco Use, Alcohol Use   REVIEW OF SYSTEMS:    GU Review Male:    Patient denies frequent urination, hard to postpone urination, burning/ pain with urination, get up at night to urinate, leakage of urine, stream starts and stops, trouble starting your streams, and have to strain to urinate .  Gastrointestinal (Lower):   Patient denies diarrhea and constipation.  Gastrointestinal (Upper):   Patient denies nausea and vomiting.  Constitutional:   Patient denies fever, night sweats, weight loss, and fatigue.  Skin:   Patient denies skin rash/ lesion and itching.  Eyes:   Patient denies blurred vision and double vision.  Ears/ Nose/ Throat:   Patient denies sore throat and sinus problems.  Hematologic/Lymphatic:   Patient denies swollen glands and easy bruising.  Cardiovascular:   Patient denies leg swelling and chest pains.  Respiratory:   Patient denies cough and shortness of breath.  Endocrine:   Patient denies excessive thirst.  Musculoskeletal:   Patient denies back pain and joint pain.  Neurological:   Patient denies headaches and dizziness.  Psychologic:   Patient denies depression and anxiety.   VITAL SIGNS: None   MULTI-SYSTEM PHYSICAL EXAMINATION:    Constitutional: Well-nourished. No physical deformities. Normally developed. Good grooming.     PAST DATA REVIEWED:  Source Of History:  Patient  Lab Test Review:   PSA  Records Review:   Pathology Reports, Previous Patient Records  X-Ray Review: C.T. Pelvis: Reviewed Films.  Bone Scan: Reviewed Films.     05/22/16 12/09/11 06/16/09 07/30/07  PSA  Total PSA 13.79 ng/dl 2.2 ng/dl 1.02 ng/dl 1.07     07/23/16  Hormones  Testosterone, Total 216.0 pg/dL    PROCEDURES: None   ASSESSMENT:      ICD-10 Details  1 GU:   Prostate Cancer - C61    PLAN:           Document Letter(s):  Created for Patient: Clinical Summary         Notes:   1. High risk prostate cancer: I had a detailed discussion with [name] regarding his prostate cancer diagnosis and options for treatment. He has already  seen Dr. Tammi Klippel this morning and appears to be set on proceeding with radiation therapy and 2 years of androgen deprivation therapy. We reviewed this treatment option and the potential risks and side effects of this approach. He is definitely not concerned about the side effects of androgen deprivation after a thorough discussion. In addition, he does care for his mother and other family members and finds benefit to radiation therapy in that it would not affect his ability to continue to care for his family members. This is quite important to him.   We did, however, discuss the option of surgical treatment for his prostate cancer as a very good option as well. We reviewed this treatment in detail.   We discussed surgical therapy for prostate cancer including the different available surgical approaches. We discussed, in detail, the risks and expectations of surgery with regard to cancer control, urinary control, and erectile function as well as the expected postoperative recovery process. Additional risks of surgery including but not limited to bleeding, infection, hernia formation, nerve damage, lymphocele  formation, bowel/rectal injury potentially necessitating colostomy, damage to the urinary tract resulting in urine leakage, urethral stricture, and the cardiopulmonary risks such as myocardial infarction, stroke, death, venothromboembolism, etc. were explained. The risk of open surgical conversion for robotic/laparoscopic prostatectomy was also discussed.   Ultimately, he would like to proceed with primary radiation therapy and 2 years of androgen deprivation. I'll notify Dr. Jeffie Pollock of his decision so that he be scheduled for fiducial marker placement and to begin androgen deprivation.   Cc: Dr. Irine Seal  Dr. Juanita Craver  Dr. Tyler Pita  Dr. Zola Button      E & M CODE: I spent at least 60 minutes face to face with the patient, more than 50% of that time was spent on counseling and/or  coordinating care.

## 2016-08-17 ENCOUNTER — Encounter: Payer: Self-pay | Admitting: Radiation Oncology

## 2016-08-18 ENCOUNTER — Telehealth: Payer: Self-pay | Admitting: Oncology

## 2016-08-18 NOTE — Telephone Encounter (Signed)
No 12/8 LOS. No appts made.

## 2016-08-19 ENCOUNTER — Encounter: Payer: Self-pay | Admitting: General Practice

## 2016-08-19 NOTE — Progress Notes (Signed)
[facility] Psychosocial Distress Screening Spiritual Care  Met with Demontre in [facility] to introduce [facility] team/resources, reviewing distress screen per protocol.  The patient scored a 2 on the Psychosocial Distress Thermometer which indicates mild distress. Also assessed for distress and other psychosocial needs.   ONCBCN DISTRESS SCREENING 08/19/2016  Screening Type Initial Screening  Distress experienced in past week (1-10) 2  Information Concerns Type Lack of info about treatment  Physical Problem type Pain  Referral to support programs Yes   Mr Maher finds significant support from faith, prayer, and participation in [facility].  He shared briefly about past losses and grief that he has learned to cope with.  During this encounter his primary focus was theological reflection and evangelism.  He finds meaning and comfort in thinking, talking, and praying about God's work in the world.  Follow up needed: No.  Pt identifies no other needs at this time.  He is aware of ongoing [facility] [place] team/programming availability, but please also page if immediate needs arise.  Thank you.   [place], [place], [facility] Pager [phone] Voicemail [phone]

## 2016-09-10 ENCOUNTER — Telehealth: Payer: Self-pay | Admitting: *Deleted

## 2016-09-10 NOTE — Telephone Encounter (Signed)
PATIENT TO HAVE GOLD SEEDS PLACED ON 10-13-16 @ [facility] AND HIS SIM ON 10-17-16 @ 1 PM @ DR. MANNING'S OFFICE

## 2016-10-14 ENCOUNTER — Other Ambulatory Visit: Payer: Self-pay | Admitting: Urology

## 2016-10-14 DIAGNOSIS — Z79899 Other long term (current) drug therapy: Secondary | ICD-10-CM

## 2016-10-16 NOTE — Progress Notes (Signed)
  Radiation Oncology         [phone] ________________________________  Name: Jeffrey Goodman MRN: [id number]  Date: 10/17/2016  DOB: 04-04-1951  SIMULATION AND TREATMENT PLANNING NOTE    ICD-9-CM ICD-10-CM   1. Malignant neoplasm of prostate ([place]) 185 C61     DIAGNOSIS:  66 y.o. gentleman with stage T2a adenocarcinoma of the prostate with a Gleason's score of 4+5=9 and a PSA of 13.79  NARRATIVE:  The patient was brought to the [facility] [place].  Identity was confirmed.  All relevant records and images related to the planned course of therapy were reviewed.  The patient freely provided informed written consent to proceed with treatment after reviewing the details related to the planned course of therapy. The consent form was witnessed and verified by the simulation staff.  Then, the patient was set-up in a stable reproducible supine position for radiation therapy.  A vacuum lock pillow device was custom fabricated to position his legs in a reproducible immobilized position.  Then, I performed a urethrogram under sterile conditions to identify the prostatic apex.  CT images were obtained.  Surface markings were placed.  The CT images were loaded into the planning software.  Then the prostate target and avoidance structures including the rectum, bladder, bowel and hips were contoured.  Treatment planning then occurred.  The radiation prescription was entered and confirmed.  A total of 5 complex treatment devices were fabricated including the BodyFix positioner and 4 MLC apertures to shield critical structures. I have requested : 3D Simulation  I have requested a DVH of the following structures: Rectum, Bladder, Left Hip, Right Hip, Small Intestine, Prostate and PTV.  PUBIC ARCH STUDY:  The patient also underwent evaluation for possible prostate seed implant boost. His 3-dimensional image study set was used for this purpose planning computer. There, on each axial slice, I contoured the  prostate gland. Then, using three-dimensional radiation planning tools I reconstructed the prostate in view of the structures from the transperineal needle pathway to assess for possible pubic arch interference. In doing so, I did not appreciate any pubic arch interference. Also, the patient's prostate volume was estimated based on the drawn structure. The volume was 40 cc.  Given the pubic arch appearance and prostate volume, patient remains a good candidate to proceed with prostate seed implant boost    PLAN:  The patient will receive 45 Gy in 25 fractions followed by prostate seed boost adding 110 Gy for a nominal total dose of 155 Gy. ________________________________  Sheral Apley Tammi Klippel, M.D.

## 2016-10-17 ENCOUNTER — Ambulatory Visit
Admission: RE | Admit: 2016-10-17 | Discharge: 2016-10-17 | Disposition: A | Discharge status: Home / Self Care | Payer: BLUE CROSS/BLUE SHIELD | Source: Ambulatory Visit | Attending: Radiation Oncology | Admitting: Radiation Oncology

## 2016-10-17 DIAGNOSIS — Z510 Encounter for antineoplastic radiation therapy: Secondary | ICD-10-CM | POA: Insufficient documentation

## 2016-10-17 DIAGNOSIS — C61 Malignant neoplasm of prostate: Secondary | ICD-10-CM

## 2016-10-18 DIAGNOSIS — C61 Malignant neoplasm of prostate: Secondary | ICD-10-CM | POA: Insufficient documentation

## 2016-10-20 DIAGNOSIS — Z510 Encounter for antineoplastic radiation therapy: Secondary | ICD-10-CM | POA: Diagnosis not present

## 2016-10-21 ENCOUNTER — Ambulatory Visit
Admission: RE | Admit: 2016-10-21 | Discharge: 2016-10-21 | Disposition: A | Discharge status: Home / Self Care | Payer: BLUE CROSS/BLUE SHIELD | Source: Ambulatory Visit | Attending: Urology | Admitting: Urology

## 2016-10-21 DIAGNOSIS — Z79899 Other long term (current) drug therapy: Secondary | ICD-10-CM

## 2016-10-29 ENCOUNTER — Ambulatory Visit
Admission: RE | Admit: 2016-10-29 | Discharge: 2016-10-29 | Disposition: A | Discharge status: Home / Self Care | Payer: BLUE CROSS/BLUE SHIELD | Source: Ambulatory Visit | Attending: Radiation Oncology | Admitting: Radiation Oncology

## 2016-10-29 DIAGNOSIS — Z510 Encounter for antineoplastic radiation therapy: Secondary | ICD-10-CM | POA: Diagnosis not present

## 2016-10-30 ENCOUNTER — Ambulatory Visit
Admission: RE | Admit: 2016-10-30 | Discharge: 2016-10-30 | Disposition: A | Discharge status: Home / Self Care | Payer: BLUE CROSS/BLUE SHIELD | Source: Ambulatory Visit | Attending: Radiation Oncology | Admitting: Radiation Oncology

## 2016-10-30 ENCOUNTER — Encounter: Payer: Self-pay | Admitting: Medical Oncology

## 2016-10-30 DIAGNOSIS — Z510 Encounter for antineoplastic radiation therapy: Secondary | ICD-10-CM | POA: Diagnosis not present

## 2016-10-31 ENCOUNTER — Encounter: Payer: Self-pay | Admitting: Radiation Oncology

## 2016-10-31 ENCOUNTER — Ambulatory Visit
Admission: RE | Admit: 2016-10-31 | Discharge: 2016-10-31 | Disposition: A | Discharge status: Home / Self Care | Payer: BLUE CROSS/BLUE SHIELD | Source: Ambulatory Visit | Attending: Radiation Oncology | Admitting: Radiation Oncology

## 2016-10-31 VITALS — BP 126/90 | HR 57 | Temp 98.3°F | Ht 71.0 in | Wt 226.4 lb

## 2016-10-31 DIAGNOSIS — Z510 Encounter for antineoplastic radiation therapy: Secondary | ICD-10-CM | POA: Diagnosis not present

## 2016-10-31 DIAGNOSIS — C61 Malignant neoplasm of prostate: Secondary | ICD-10-CM

## 2016-10-31 NOTE — Progress Notes (Signed)
  Radiation Oncology         [phone]   Name: Jeffrey Goodman MRN: [id number]   Date: 10/31/2016  DOB: 04-26-1951   Weekly Radiation Therapy Management    ICD-9-CM ICD-10-CM   1. Malignant neoplasm of prostate ([place]) 185 C61     Current Dose: 5.4 Gy  Planned Dose:  45 Gy + seed  Narrative The patient presents for routine under treatment assessment.  The patient has completed 3 fractions to his prostate.  He denies having pain. He does report having an increase in urinary frequency and urgency and has nocturia x3. He denies having hematuria or dysuria. He denies having diarrhea, but increased bowel movements of up to 3 a day.  He reports having fatigue before radiation from androgen depravation injections and he has not noticed an increase since starting radiation.  Set-up films were reviewed. The chart was checked.  Physical Findings  height is 5\' 11"  (1.803 m) and weight is 226 lb 6.4 oz (102.7 kg). His oral temperature is 98.3 F (36.8 C). His blood pressure is 126/90 and his pulse is 57 (abnormal). His oxygen saturation is 100%. . Weight essentially stable.  No significant changes.  Impression The patient is tolerating radiation.  Plan Continue treatment as planned.         Sheral Apley Tammi Klippel, M.D.  This document serves as a record of services personally performed by Tyler Pita, MD. It was created on his behalf by Darcus Austin, a trained medical scribe. The creation of this record is based on the scribe's personal observations and the provider's statements to them. This document has been checked and approved by the attending provider.

## 2016-10-31 NOTE — Progress Notes (Addendum)
Jeffrey Goodman has completed 3 fractions to his prostate.  He denies having pain.  He does report having an increase in urinary frequency and urgency.  He denies having hematuria or dysuria.  He reports having nocturia x 3.  He denies having diarrhea but has increased his bowel movements to 3 a day.  He reports having fatigue before radiation from androgen depravation injections.  He has not noticed an increase since starting radiation.  BP 126/90 (BP Location: Left Arm, Patient Position: Sitting)   Pulse (!) 57   Temp 98.3 F (36.8 C) (Oral)   Ht 5\' 11"  (1.803 m)   Wt 226 lb 6.4 oz (102.7 kg)   SpO2 100%   BMI 31.58 kg/m    Wt Readings from Last 3 Encounters:  10/31/16 226 lb 6.4 oz (102.7 kg)  08/15/16 233 lb (105.7 kg)  03/19/15 234 lb (106.1 kg)

## 2016-11-03 ENCOUNTER — Ambulatory Visit
Admission: RE | Admit: 2016-11-03 | Discharge: 2016-11-03 | Disposition: A | Discharge status: Home / Self Care | Payer: BLUE CROSS/BLUE SHIELD | Source: Ambulatory Visit | Attending: Radiation Oncology | Admitting: Radiation Oncology

## 2016-11-03 DIAGNOSIS — Z510 Encounter for antineoplastic radiation therapy: Secondary | ICD-10-CM | POA: Diagnosis not present

## 2016-11-04 ENCOUNTER — Ambulatory Visit
Admission: RE | Admit: 2016-11-04 | Discharge: 2016-11-04 | Disposition: A | Discharge status: Home / Self Care | Payer: BLUE CROSS/BLUE SHIELD | Source: Ambulatory Visit | Attending: Radiation Oncology | Admitting: Radiation Oncology

## 2016-11-04 DIAGNOSIS — Z510 Encounter for antineoplastic radiation therapy: Secondary | ICD-10-CM | POA: Diagnosis not present

## 2016-11-05 ENCOUNTER — Ambulatory Visit
Admission: RE | Admit: 2016-11-05 | Discharge: 2016-11-05 | Disposition: A | Discharge status: Home / Self Care | Payer: BLUE CROSS/BLUE SHIELD | Source: Ambulatory Visit | Attending: Radiation Oncology | Admitting: Radiation Oncology

## 2016-11-05 DIAGNOSIS — Z510 Encounter for antineoplastic radiation therapy: Secondary | ICD-10-CM | POA: Diagnosis not present

## 2016-11-06 ENCOUNTER — Ambulatory Visit
Admission: RE | Admit: 2016-11-06 | Discharge: 2016-11-06 | Disposition: A | Discharge status: Home / Self Care | Payer: BLUE CROSS/BLUE SHIELD | Source: Ambulatory Visit | Attending: Radiation Oncology | Admitting: Radiation Oncology

## 2016-11-06 ENCOUNTER — Encounter: Payer: Self-pay | Admitting: Radiation Oncology

## 2016-11-06 VITALS — BP 122/82 | HR 58 | Temp 97.7°F | Resp 18 | Ht 71.0 in | Wt 227.8 lb

## 2016-11-06 DIAGNOSIS — Z510 Encounter for antineoplastic radiation therapy: Secondary | ICD-10-CM | POA: Diagnosis not present

## 2016-11-06 DIAGNOSIS — C61 Malignant neoplasm of prostate: Secondary | ICD-10-CM

## 2016-11-06 NOTE — Progress Notes (Signed)
Jeffrey Goodman has completed 6 fractions to his prostate.   Reports  having low back pain 4/10 Hydrocodone.  He does report having an increase in urinary frequency and urgency.  He denies having hematuria or dysuria.  He reports having nocturia x 2-3.  He denies having diarrhea having normal bowel movements three times per day .  He reports having fatigue before radiation from androgen depravation injections ongoing.  He has not noticed an increase since starting radiation. Wt Readings from Last 3 Encounters:  11/06/16 227 lb 12.8 oz (103.3 kg)  10/31/16 226 lb 6.4 oz (102.7 kg)  08/15/16 233 lb (105.7 kg)  BP 122/82   Pulse (!) 58   Temp 97.7 F (36.5 C) (Oral)   Resp 18   Ht 5\' 11"  (1.803 m)   Wt 227 lb 12.8 oz (103.3 kg)   SpO2 99%   BMI 31.77 kg/m

## 2016-11-06 NOTE — Progress Notes (Signed)
  Radiation Oncology         [phone]   Name: Jeffrey Goodman MRN: [id number]   Date: 11/06/2016  DOB: [birth year]   Weekly Radiation Therapy Management    ICD-9-CM ICD-10-CM   1. Malignant neoplasm of prostate ([place]) 185 C61     Current Dose: 12.6 Gy  Planned Dose:  45 Gy + seed  Narrative The patient presents for routine under treatment assessment.  The patient has completed 6 fractions to his prostate.  He reports 4/10 low back that is alleviated with Hydrocodone. He does report having an increase in urinary frequency and urgency and has nocturia x 2-3. He denies having hematuria or dysuria. He denies having diarrhea, but increased bowel movements of up to 3 a day.  He reports having fatigue before radiation from androgen depravation injections and he has not noticed an increase since starting radiation.  Set-up films were reviewed. The chart was checked.  Physical Findings  height is 5\' 11"  (1.803 m) and weight is 227 lb 12.8 oz (103.3 kg). His oral temperature is 97.7 F (36.5 C). His blood pressure is 122/82 and his pulse is 58 (abnormal). His respiration is 18 and oxygen saturation is 99%. . Weight essentially stable.  No significant changes.  Impression The patient is tolerating radiation.  Plan Continue treatment as planned.         Sheral Apley Tammi Klippel, M.D.  This document serves as a record of services personally performed by Tyler Pita, MD. It was created on his behalf by Bethann Humble, a trained medical scribe. The creation of this record is based on the scribe's personal observations and the provider's statements to them. This document has been checked and approved by the attending provider.

## 2016-11-07 ENCOUNTER — Ambulatory Visit
Admission: RE | Admit: 2016-11-07 | Discharge: 2016-11-07 | Disposition: A | Discharge status: Home / Self Care | Payer: BLUE CROSS/BLUE SHIELD | Source: Ambulatory Visit | Attending: Radiation Oncology | Admitting: Radiation Oncology

## 2016-11-07 DIAGNOSIS — Z510 Encounter for antineoplastic radiation therapy: Secondary | ICD-10-CM | POA: Diagnosis not present

## 2016-11-07 NOTE — Addendum Note (Signed)
Encounter addended by: Heywood Footman, RN on: 11/07/2016  2:48 PM<BR>    Actions taken: Patient Education assessment filed

## 2016-11-10 ENCOUNTER — Ambulatory Visit
Admission: RE | Admit: 2016-11-10 | Discharge: 2016-11-10 | Disposition: A | Discharge status: Home / Self Care | Payer: BLUE CROSS/BLUE SHIELD | Source: Ambulatory Visit | Attending: Radiation Oncology | Admitting: Radiation Oncology

## 2016-11-10 DIAGNOSIS — Z510 Encounter for antineoplastic radiation therapy: Secondary | ICD-10-CM | POA: Diagnosis not present

## 2016-11-11 ENCOUNTER — Ambulatory Visit
Admission: RE | Admit: 2016-11-11 | Discharge: 2016-11-11 | Disposition: A | Discharge status: Home / Self Care | Payer: BLUE CROSS/BLUE SHIELD | Source: Ambulatory Visit | Attending: Radiation Oncology | Admitting: Radiation Oncology

## 2016-11-11 ENCOUNTER — Other Ambulatory Visit: Payer: Self-pay | Admitting: Urology

## 2016-11-11 DIAGNOSIS — Z510 Encounter for antineoplastic radiation therapy: Secondary | ICD-10-CM | POA: Diagnosis not present

## 2016-11-12 ENCOUNTER — Ambulatory Visit
Admission: RE | Admit: 2016-11-12 | Discharge: 2016-11-12 | Disposition: A | Discharge status: Home / Self Care | Payer: BLUE CROSS/BLUE SHIELD | Source: Ambulatory Visit | Attending: Radiation Oncology | Admitting: Radiation Oncology

## 2016-11-12 DIAGNOSIS — Z510 Encounter for antineoplastic radiation therapy: Secondary | ICD-10-CM | POA: Diagnosis not present

## 2016-11-13 ENCOUNTER — Ambulatory Visit
Admission: RE | Admit: 2016-11-13 | Discharge: 2016-11-13 | Disposition: A | Discharge status: Home / Self Care | Payer: BLUE CROSS/BLUE SHIELD | Source: Ambulatory Visit | Attending: Radiation Oncology | Admitting: Radiation Oncology

## 2016-11-13 DIAGNOSIS — Z510 Encounter for antineoplastic radiation therapy: Secondary | ICD-10-CM | POA: Diagnosis not present

## 2016-11-14 ENCOUNTER — Ambulatory Visit
Admission: RE | Admit: 2016-11-14 | Discharge: 2016-11-14 | Disposition: A | Discharge status: Home / Self Care | Payer: BLUE CROSS/BLUE SHIELD | Source: Ambulatory Visit | Attending: Radiation Oncology | Admitting: Radiation Oncology

## 2016-11-14 DIAGNOSIS — Z510 Encounter for antineoplastic radiation therapy: Secondary | ICD-10-CM | POA: Diagnosis not present

## 2016-11-17 ENCOUNTER — Other Ambulatory Visit: Payer: Self-pay

## 2016-11-17 ENCOUNTER — Ambulatory Visit (HOSPITAL_COMMUNITY)
Admission: RE | Admit: 2016-11-17 | Discharge: 2016-11-17 | Disposition: A | Discharge status: Home / Self Care | Payer: BLUE CROSS/BLUE SHIELD | Source: Ambulatory Visit | Attending: Urology | Admitting: Urology

## 2016-11-17 ENCOUNTER — Encounter (HOSPITAL_BASED_OUTPATIENT_CLINIC_OR_DEPARTMENT_OTHER)
Admission: RE | Admit: 2016-11-17 | Discharge: 2016-11-17 | Disposition: A | Discharge status: Home / Self Care | Payer: BLUE CROSS/BLUE SHIELD | Source: Ambulatory Visit | Attending: Urology | Admitting: Urology

## 2016-11-17 ENCOUNTER — Ambulatory Visit
Admission: RE | Admit: 2016-11-17 | Discharge: 2016-11-17 | Disposition: A | Discharge status: Home / Self Care | Payer: BLUE CROSS/BLUE SHIELD | Source: Ambulatory Visit | Attending: Radiation Oncology | Admitting: Radiation Oncology

## 2016-11-17 DIAGNOSIS — C61 Malignant neoplasm of prostate: Secondary | ICD-10-CM | POA: Diagnosis not present

## 2016-11-17 DIAGNOSIS — Z510 Encounter for antineoplastic radiation therapy: Secondary | ICD-10-CM | POA: Diagnosis not present

## 2016-11-18 ENCOUNTER — Ambulatory Visit
Admission: RE | Admit: 2016-11-18 | Discharge: 2016-11-18 | Disposition: A | Discharge status: Home / Self Care | Payer: BLUE CROSS/BLUE SHIELD | Source: Ambulatory Visit | Attending: Radiation Oncology | Admitting: Radiation Oncology

## 2016-11-18 DIAGNOSIS — Z510 Encounter for antineoplastic radiation therapy: Secondary | ICD-10-CM | POA: Diagnosis not present

## 2016-11-19 ENCOUNTER — Ambulatory Visit
Admission: RE | Admit: 2016-11-19 | Discharge: 2016-11-19 | Disposition: A | Discharge status: Home / Self Care | Payer: BLUE CROSS/BLUE SHIELD | Source: Ambulatory Visit | Attending: Radiation Oncology | Admitting: Radiation Oncology

## 2016-11-19 DIAGNOSIS — Z510 Encounter for antineoplastic radiation therapy: Secondary | ICD-10-CM | POA: Diagnosis not present

## 2016-11-20 ENCOUNTER — Ambulatory Visit
Admission: RE | Admit: 2016-11-20 | Discharge: 2016-11-20 | Disposition: A | Discharge status: Home / Self Care | Payer: BLUE CROSS/BLUE SHIELD | Source: Ambulatory Visit | Attending: Radiation Oncology | Admitting: Radiation Oncology

## 2016-11-20 DIAGNOSIS — Z510 Encounter for antineoplastic radiation therapy: Secondary | ICD-10-CM | POA: Diagnosis not present

## 2016-11-21 ENCOUNTER — Ambulatory Visit
Admission: RE | Admit: 2016-11-21 | Discharge: 2016-11-21 | Disposition: A | Discharge status: Home / Self Care | Payer: BLUE CROSS/BLUE SHIELD | Source: Ambulatory Visit | Attending: Radiation Oncology | Admitting: Radiation Oncology

## 2016-11-21 DIAGNOSIS — Z510 Encounter for antineoplastic radiation therapy: Secondary | ICD-10-CM | POA: Diagnosis not present

## 2016-11-24 ENCOUNTER — Ambulatory Visit
Admission: RE | Admit: 2016-11-24 | Discharge: 2016-11-24 | Disposition: A | Discharge status: Home / Self Care | Payer: BLUE CROSS/BLUE SHIELD | Source: Ambulatory Visit | Attending: Radiation Oncology | Admitting: Radiation Oncology

## 2016-11-24 DIAGNOSIS — Z510 Encounter for antineoplastic radiation therapy: Secondary | ICD-10-CM | POA: Diagnosis not present

## 2016-11-25 ENCOUNTER — Ambulatory Visit
Admission: RE | Admit: 2016-11-25 | Discharge: 2016-11-25 | Disposition: A | Discharge status: Home / Self Care | Payer: BLUE CROSS/BLUE SHIELD | Source: Ambulatory Visit | Attending: Radiation Oncology | Admitting: Radiation Oncology

## 2016-11-25 DIAGNOSIS — Z510 Encounter for antineoplastic radiation therapy: Secondary | ICD-10-CM | POA: Diagnosis not present

## 2016-11-26 ENCOUNTER — Ambulatory Visit
Admission: RE | Admit: 2016-11-26 | Discharge: 2016-11-26 | Disposition: A | Discharge status: Home / Self Care | Payer: BLUE CROSS/BLUE SHIELD | Source: Ambulatory Visit | Attending: Radiation Oncology | Admitting: Radiation Oncology

## 2016-11-26 DIAGNOSIS — Z510 Encounter for antineoplastic radiation therapy: Secondary | ICD-10-CM | POA: Diagnosis not present

## 2016-11-27 ENCOUNTER — Ambulatory Visit
Admission: RE | Admit: 2016-11-27 | Discharge: 2016-11-27 | Disposition: A | Discharge status: Home / Self Care | Payer: BLUE CROSS/BLUE SHIELD | Source: Ambulatory Visit | Attending: Radiation Oncology | Admitting: Radiation Oncology

## 2016-11-27 DIAGNOSIS — Z510 Encounter for antineoplastic radiation therapy: Secondary | ICD-10-CM | POA: Diagnosis not present

## 2016-11-28 ENCOUNTER — Ambulatory Visit
Admission: RE | Admit: 2016-11-28 | Discharge: 2016-11-28 | Disposition: A | Discharge status: Home / Self Care | Payer: BLUE CROSS/BLUE SHIELD | Source: Ambulatory Visit | Attending: Radiation Oncology | Admitting: Radiation Oncology

## 2016-11-28 DIAGNOSIS — Z510 Encounter for antineoplastic radiation therapy: Secondary | ICD-10-CM | POA: Diagnosis not present

## 2016-12-01 ENCOUNTER — Ambulatory Visit
Admission: RE | Admit: 2016-12-01 | Discharge: 2016-12-01 | Disposition: A | Discharge status: Home / Self Care | Payer: BLUE CROSS/BLUE SHIELD | Source: Ambulatory Visit | Attending: Radiation Oncology | Admitting: Radiation Oncology

## 2016-12-01 DIAGNOSIS — Z510 Encounter for antineoplastic radiation therapy: Secondary | ICD-10-CM | POA: Diagnosis not present

## 2016-12-02 ENCOUNTER — Ambulatory Visit
Admission: RE | Admit: 2016-12-02 | Discharge: 2016-12-02 | Disposition: A | Discharge status: Home / Self Care | Payer: BLUE CROSS/BLUE SHIELD | Source: Ambulatory Visit | Attending: Radiation Oncology | Admitting: Radiation Oncology

## 2016-12-02 DIAGNOSIS — Z510 Encounter for antineoplastic radiation therapy: Secondary | ICD-10-CM | POA: Diagnosis not present

## 2016-12-03 ENCOUNTER — Ambulatory Visit: Payer: BLUE CROSS/BLUE SHIELD

## 2016-12-04 ENCOUNTER — Encounter: Payer: Self-pay | Admitting: Radiation Oncology

## 2016-12-04 ENCOUNTER — Ambulatory Visit: Payer: BLUE CROSS/BLUE SHIELD

## 2016-12-04 NOTE — Progress Notes (Signed)
  Radiation Oncology         (336) [phone] ________________________________  Name: Jeffrey Goodman MRN: [id number]  Date: 12/04/2016  DOB: May 19, 1951  End of Treatment Note  Diagnosis: 66 y.o. 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 During the treatment, patient complained of increased urinary frequency, and nocturia x 2-3. He also noted 4/10 lower back pain that was managed with Hydrocodone.  Plan: The patient has completed radiation treatment. The patient will return for seed implant boost on 12/23/16. I advised him to call or return sooner if he has any questions or concerns related to his recovery or treatment. ________________________________  Sheral Apley. Tammi Klippel, M.D.   This document serves as a record of services personally performed by Tyler Pita, MD. It was created on his behalf by Bethann Humble, a trained medical scribe. The creation of this record is based on the scribe's personal observations and the provider's statements to them. This document has been checked and approved by the attending provider.

## 2016-12-05 ENCOUNTER — Ambulatory Visit: Payer: BLUE CROSS/BLUE SHIELD

## 2016-12-08 ENCOUNTER — Ambulatory Visit: Payer: BLUE CROSS/BLUE SHIELD

## 2016-12-09 ENCOUNTER — Ambulatory Visit: Payer: BLUE CROSS/BLUE SHIELD

## 2016-12-10 ENCOUNTER — Ambulatory Visit: Payer: BLUE CROSS/BLUE SHIELD

## 2016-12-11 ENCOUNTER — Ambulatory Visit: Payer: BLUE CROSS/BLUE SHIELD

## 2016-12-12 ENCOUNTER — Ambulatory Visit: Payer: BLUE CROSS/BLUE SHIELD

## 2016-12-15 ENCOUNTER — Ambulatory Visit: Payer: BLUE CROSS/BLUE SHIELD

## 2016-12-15 ENCOUNTER — Telehealth: Payer: Self-pay | Admitting: *Deleted

## 2016-12-15 NOTE — Telephone Encounter (Signed)
CALLED PATIENT TO REMIND TO GET LABS DONE ON 12-16-16 FOR PROCEDURE ON 12-23-16, SPOKE WITH PATIENT AND HE IS AWARE OF THIS APPT.

## 2016-12-16 ENCOUNTER — Ambulatory Visit: Payer: BLUE CROSS/BLUE SHIELD

## 2016-12-16 DIAGNOSIS — Z79818 Long term (current) use of other agents affecting estrogen receptors and estrogen levels: Secondary | ICD-10-CM | POA: Diagnosis not present

## 2016-12-16 DIAGNOSIS — C61 Malignant neoplasm of prostate: Secondary | ICD-10-CM | POA: Diagnosis not present

## 2016-12-16 DIAGNOSIS — Z833 Family history of diabetes mellitus: Secondary | ICD-10-CM | POA: Diagnosis not present

## 2016-12-16 DIAGNOSIS — Z8249 Family history of ischemic heart disease and other diseases of the circulatory system: Secondary | ICD-10-CM | POA: Diagnosis not present

## 2016-12-16 DIAGNOSIS — Z87442 Personal history of urinary calculi: Secondary | ICD-10-CM | POA: Diagnosis not present

## 2016-12-16 DIAGNOSIS — M1990 Unspecified osteoarthritis, unspecified site: Secondary | ICD-10-CM | POA: Diagnosis not present

## 2016-12-16 DIAGNOSIS — Z823 Family history of stroke: Secondary | ICD-10-CM | POA: Diagnosis not present

## 2016-12-16 DIAGNOSIS — Z9889 Other specified postprocedural states: Secondary | ICD-10-CM | POA: Diagnosis not present

## 2016-12-16 DIAGNOSIS — K219 Gastro-esophageal reflux disease without esophagitis: Secondary | ICD-10-CM | POA: Diagnosis not present

## 2016-12-16 DIAGNOSIS — I10 Essential (primary) hypertension: Secondary | ICD-10-CM | POA: Diagnosis not present

## 2016-12-16 DIAGNOSIS — Z841 Family history of disorders of kidney and ureter: Secondary | ICD-10-CM | POA: Diagnosis not present

## 2016-12-16 LAB — COMPREHENSIVE METABOLIC PANEL
ALK PHOS: 84 U/L (ref 38–126)
ALT: 25 U/L (ref 17–63)
ANION GAP: 8 (ref 5–15)
AST: 23 U/L (ref 15–41)
Albumin: 4.2 g/dL (ref 3.5–5.0)
BUN: 17 mg/dL (ref 6–20)
CO2: 25 mmol/L (ref 22–32)
CREATININE: 0.85 mg/dL (ref 0.61–1.24)
Calcium: 9.3 mg/dL (ref 8.9–10.3)
Chloride: 104 mmol/L (ref 101–111)
Glucose, Bld: 102 mg/dL — ABNORMAL HIGH (ref 65–99)
Potassium: 4.1 mmol/L (ref 3.5–5.1)
SODIUM: 137 mmol/L (ref 135–145)
Total Bilirubin: 1.2 mg/dL (ref 0.3–1.2)
Total Protein: 7.3 g/dL (ref 6.5–8.1)

## 2016-12-16 LAB — CBC
HCT: 39.4 % (ref 39.0–52.0)
HEMOGLOBIN: 14.2 g/dL (ref 13.0–17.0)
MCH: 32.3 pg (ref 26.0–34.0)
MCHC: 36 g/dL (ref 30.0–36.0)
MCV: 89.7 fL (ref 78.0–100.0)
PLATELETS: 182 10*3/uL (ref 150–400)
RBC: 4.39 MIL/uL (ref 4.22–5.81)
RDW: 13.6 % (ref 11.5–15.5)
WBC: 3.7 10*3/uL — ABNORMAL LOW (ref 4.0–10.5)

## 2016-12-16 LAB — PROTIME-INR
INR: 1.01
Prothrombin Time: 13.4 seconds (ref 11.4–15.2)

## 2016-12-16 LAB — APTT: aPTT: 29 seconds (ref 24–36)

## 2016-12-17 ENCOUNTER — Encounter (HOSPITAL_BASED_OUTPATIENT_CLINIC_OR_DEPARTMENT_OTHER): Payer: Self-pay | Admitting: *Deleted

## 2016-12-17 ENCOUNTER — Ambulatory Visit: Payer: BLUE CROSS/BLUE SHIELD

## 2016-12-18 ENCOUNTER — Ambulatory Visit: Payer: BLUE CROSS/BLUE SHIELD

## 2016-12-19 ENCOUNTER — Encounter (HOSPITAL_BASED_OUTPATIENT_CLINIC_OR_DEPARTMENT_OTHER): Payer: Self-pay | Admitting: *Deleted

## 2016-12-19 ENCOUNTER — Ambulatory Visit: Payer: BLUE CROSS/BLUE SHIELD

## 2016-12-19 NOTE — Progress Notes (Signed)
NPO AFTER MN.  ARRIVE AT 0600.  CURRENT LAB RESULTS, CXR, AND EKG IN CHART AND EPIC.  WILL TAKE NORVASC, LOPRESSOR, AND PRILOSEC AM DOS W/ SIPS OF WATER.  WILL DO FLEET ENEMA AM DOS.

## 2016-12-22 ENCOUNTER — Ambulatory Visit: Payer: BLUE CROSS/BLUE SHIELD

## 2016-12-22 ENCOUNTER — Telehealth: Payer: Self-pay | Admitting: *Deleted

## 2016-12-22 NOTE — H&P (Signed)
CC: I have prostate cancer.  HPI: Jeffrey Goodman is a 66 year-old male established patient who is here evaluation for treatment of prostate cancer.  Jeffrey Goodman returns today in f/u for his T2a N0 M0 Gleason 9 prostate cancer that was found on a biopsy on 06/23/16 for a right nodule and PSA of 13.79. He has elected ADT and EXRT for therapy and is almost 3 months out from firmagon 240mg . He was given Lupron 45mg  IM on 2/5. He also had gold seeds placed then and today (3/27) is his last day for XRT. He is scheduled for brachytherapy seed placement on 4/17.   He is doing well from a voiding perspective. He does have some mild dysuria but not having a huge increase in urgency and nocturia. He'll have some intermittent hot flashes from lupron. Denies fevers. He's using some interesting combination of supplements to combat fecal urgency and vasomotor symptoms. Mostly niacin for energy and beta-carotene. No recent abx treatment for infection. Denies any flank or back pain, no intercal stone events. He does a [place] history.   He has undergone External Beam Radiation Therapy for treatment. He has undergone Hormonal Therapy for treatment.   He has not recently had unwanted weight loss. He is not having pain in new locations.     ALLERGIES: No Allergies    MEDICATIONS: Benadryl Allergy 25 mg tablet Oral  Daily Vitamin tablet Oral  Diltiazem 24Hr Cd 240 mg capsule, ext release 24 hr Oral  Guanfacine Hcl 1 mg tablet  Hydrochlorothiazide 25 mg tablet Oral  Metoprolol Tartrate 25 mg tablet Oral     GU PSH: ESWL - 2013, 2012, 2008 Locm 300-399Mg /Ml Iodine,1Ml - 07/04/2016 PLACE RT DEVICE/MARKER, PROS - 10/13/2016 Prostate Needle Biopsy - 06/23/2016      PSH Notes: Knee Replacement, Lithotripsy, Lithotripsy, Lithotripsy   NON-GU PSH: Revise Knee Joint - 2014 Surgical Pathology, Gross And Microscopic Examination For Prostate Needle - 06/23/2016    GU PMH: Prostate Cancer, He has a T2a N0 M0 Gleason  9 prostate cancer. - 07/23/2016 Elevated PSA - 06/23/2016, He has a marked elevation of the PSA but the last I had was from 2008. , - 05/26/2016 Nocturia, He has nocturia and intermittency. - 05/26/2016 Nodular prostate w/ LUTS, He has a right prostate nodule but no extracapsular gross disease. - 05/26/2016 Kidney Stone, Bilateral kidney stones - 09/20/2015, Kidney stone on right side, - 06/21/2014 Abdominal Pain Unspec, Rt flank pain - 2015 Calculus Ureter, Calculus of right ureter - 2015, Proximal Ureteral Stone On The Left, - 2014, Ureteral Stone, - 2014 Flank Pain, Diffuse Abdominal Pain - 2014      PMH Notes:  2007-07-29 14:49:23 - Note: Nephrolithiasis Of The Right Kidney  2011-07-14 10:41:06 - Note: Arthritis   NON-GU PMH: Long term (current) use of other agents affecting estrogen receptors and estrogen levels - 10/13/2016 Encounter for general adult medical examination without abnormal findings, Encounter for preventive health examination - 06/21/2014 Personal history of other diseases of the circulatory system, History of hypertension - 2014 Unspecified disorder of calcium metabolism, Hypercalciuria - 2014    FAMILY HISTORY: Diabetes - Father Family Health Status Number - Runs In Family Hypertension - Father, Mother nephrolithiasis - Runs In Family Stroke Syndrome - Mother   SOCIAL HISTORY: Marital Status: Single     Notes: Occupation:, Caffeine Use, Marital History - Single, Never A Smoker, Tobacco Use, Alcohol Use   REVIEW OF SYSTEMS:    GU Review Male:   Patient reports frequent  urination, burning/ pain with urination, and get up at night to urinate. Patient denies hard to postpone urination, leakage of urine, stream starts and stops, trouble starting your stream, have to strain to urinate , erection problems, and penile pain.  Gastrointestinal (Upper):   Patient denies nausea, vomiting, and indigestion/ heartburn.  Gastrointestinal (Lower):   Patient denies diarrhea and  constipation.  Constitutional:   Patient denies fever, night sweats, weight loss, and fatigue.  Skin:   Patient denies skin rash/ lesion and itching.  Eyes:   Patient denies blurred vision and double vision.  Ears/ Nose/ Throat:   Patient denies sore throat and sinus problems.  Hematologic/Lymphatic:   Patient denies swollen glands and easy bruising.  Cardiovascular:   Patient denies leg swelling and chest pains.  Respiratory:   Patient denies cough and shortness of breath.  Endocrine:   Patient denies excessive thirst.  Musculoskeletal:   Patient reports back pain. Patient denies joint pain.  Neurological:   Patient denies headaches and dizziness.  Psychologic:   Patient denies depression and anxiety.   VITAL SIGNS:      12/02/2016 11:08 AM  Weight 223 lb / 101.15 kg  Height 71 in / 180.34 cm  BP 119/73 mmHg  Pulse 53 /min  Temperature 98.0 F / 37 C  BMI 31.1 kg/m   MULTI-SYSTEM PHYSICAL EXAMINATION:    Constitutional: Well-nourished. No physical deformities. Normally developed. Good grooming.  Respiratory: No labored breathing, no use of accessory muscles. CTA bilaterally.  Cardiovascular: Normal temperature, normal extremity pulses, no swelling, no varicosities. RRR w/o murmur.  Skin: No paleness, no jaundice, no cyanosis. No lesion, no ulcer, no rash.  Neurologic / Psychiatric: Oriented to time, oriented to place, oriented to person. No depression, no anxiety, no agitation.  Gastrointestinal: No mass, no tenderness, no rigidity, non obese abdomen. No flank of suprapubic tenderness.   Musculoskeletal: Spine, ribs, pelvis no bilateral tenderness. Normal gait and station of head and neck.     PAST DATA REVIEWED:  Source Of History:  Patient  Records Review:   Previous Patient Records  Urine Test Review:   Urinalysis   05/22/16 12/09/11 06/16/09 07/30/07  PSA  Total PSA 13.79 ng/dl 2.2 ng/dl 1.02 ng/dl 1.07     07/23/16  Hormones  Testosterone, Total 216.0 pg/dL     PROCEDURES:          Urinalysis Dipstick Dipstick Cont'd  Color: Yellow Bilirubin: Neg  Appearance: Clear Ketones: Neg  Specific Gravity: 1.020 Blood: Neg  pH: 6.5 Protein: Neg  Glucose: Neg Urobilinogen: 0.2    Nitrites: Neg    Leukocyte Esterase: Neg    ASSESSMENT:      ICD-10 Details  1 GU:   Prostate Cancer - C61   2 NON-GU:   Long term (current) use of other agents affecting estrogen receptors and estrogen levels - Z79.818 Stable   PLAN:           Orders Labs Urine Culture          Schedule Return Visit/Planned Activity: Keep Scheduled Appointment - Follow up MD          Document Letter(s):  Created for Patient: Clinical Summary         Notes:   Urine sent for c/s to serve as baseline for upcoming procedure. PE wnl. He is doing well given his circumstances with recent Lupron and ongoing XRT for treatment of prostate cancer. Encouraged to remain well educated on his supplement use but he is keeping  counsel with Dr Tresa Moore regarding this.

## 2016-12-22 NOTE — Telephone Encounter (Signed)
Called patient to remind of procedure for 12-23-16, lvm for a return call

## 2016-12-22 NOTE — Anesthesia Preprocedure Evaluation (Addendum)
Anesthesia Evaluation  Patient identified by MRN, date of birth, ID band Patient awake    Reviewed: Allergy & Precautions, NPO status , Patient's Chart, lab work & pertinent test results  Airway Mallampati: II  TM Distance: >3 FB Neck ROM: Full    Dental no notable dental hx.    Pulmonary neg pulmonary ROS,    Pulmonary exam normal breath sounds clear to auscultation       Cardiovascular hypertension, Normal cardiovascular exam Rhythm:Regular Rate:Normal     Neuro/Psych negative neurological ROS  negative psych ROS   GI/Hepatic Neg liver ROS, GERD  ,  Endo/Other  negative endocrine ROS  Renal/GU negative Renal ROS  negative genitourinary   Musculoskeletal negative musculoskeletal ROS (+)   Abdominal   Peds negative pediatric ROS (+)  Hematology negative hematology ROS (+)   Anesthesia Other Findings   Reproductive/Obstetrics negative OB ROS                             Anesthesia Physical Anesthesia Plan  ASA: II  Anesthesia Plan: General   Post-op Pain Management:    Induction: Intravenous  Airway Management Planned: LMA and Oral ETT  Additional Equipment:   Intra-op Plan:   Post-operative Plan: Extubation in OR  Informed Consent: I have reviewed the patients History and Physical, chart, labs and discussed the procedure including the risks, benefits and alternatives for the proposed anesthesia with the patient or authorized representative who has indicated his/her understanding and acceptance.   Dental advisory given  Plan Discussed with: CRNA and Surgeon  Anesthesia Plan Comments:         Anesthesia Quick Evaluation

## 2016-12-23 ENCOUNTER — Ambulatory Visit (HOSPITAL_BASED_OUTPATIENT_CLINIC_OR_DEPARTMENT_OTHER): Payer: BLUE CROSS/BLUE SHIELD | Admitting: Anesthesiology

## 2016-12-23 ENCOUNTER — Ambulatory Visit: Payer: BLUE CROSS/BLUE SHIELD

## 2016-12-23 ENCOUNTER — Ambulatory Visit (HOSPITAL_COMMUNITY): Payer: BLUE CROSS/BLUE SHIELD

## 2016-12-23 ENCOUNTER — Encounter (HOSPITAL_BASED_OUTPATIENT_CLINIC_OR_DEPARTMENT_OTHER): Payer: Self-pay

## 2016-12-23 ENCOUNTER — Ambulatory Visit (HOSPITAL_BASED_OUTPATIENT_CLINIC_OR_DEPARTMENT_OTHER)
Admission: RE | Admit: 2016-12-23 | Discharge: 2016-12-23 | Disposition: A | Discharge status: Home / Self Care | Payer: BLUE CROSS/BLUE SHIELD | Source: Ambulatory Visit | Attending: Urology | Admitting: Urology

## 2016-12-23 ENCOUNTER — Encounter (HOSPITAL_BASED_OUTPATIENT_CLINIC_OR_DEPARTMENT_OTHER)
Admission: RE | Disposition: A | Discharge status: Home / Self Care | Payer: Self-pay | Source: Ambulatory Visit | Attending: Urology

## 2016-12-23 ENCOUNTER — Other Ambulatory Visit: Payer: Self-pay | Admitting: Urology

## 2016-12-23 DIAGNOSIS — Z87442 Personal history of urinary calculi: Secondary | ICD-10-CM | POA: Insufficient documentation

## 2016-12-23 DIAGNOSIS — C61 Malignant neoplasm of prostate: Secondary | ICD-10-CM

## 2016-12-23 DIAGNOSIS — Z8249 Family history of ischemic heart disease and other diseases of the circulatory system: Secondary | ICD-10-CM | POA: Insufficient documentation

## 2016-12-23 DIAGNOSIS — Z9889 Other specified postprocedural states: Secondary | ICD-10-CM | POA: Insufficient documentation

## 2016-12-23 DIAGNOSIS — Z841 Family history of disorders of kidney and ureter: Secondary | ICD-10-CM | POA: Insufficient documentation

## 2016-12-23 DIAGNOSIS — Z79818 Long term (current) use of other agents affecting estrogen receptors and estrogen levels: Secondary | ICD-10-CM | POA: Insufficient documentation

## 2016-12-23 DIAGNOSIS — I10 Essential (primary) hypertension: Secondary | ICD-10-CM | POA: Insufficient documentation

## 2016-12-23 DIAGNOSIS — Z833 Family history of diabetes mellitus: Secondary | ICD-10-CM | POA: Insufficient documentation

## 2016-12-23 DIAGNOSIS — K219 Gastro-esophageal reflux disease without esophagitis: Secondary | ICD-10-CM | POA: Insufficient documentation

## 2016-12-23 DIAGNOSIS — Z823 Family history of stroke: Secondary | ICD-10-CM | POA: Insufficient documentation

## 2016-12-23 DIAGNOSIS — M1990 Unspecified osteoarthritis, unspecified site: Secondary | ICD-10-CM | POA: Insufficient documentation

## 2016-12-23 HISTORY — PX: RADIOACTIVE SEED IMPLANT: SHX5150

## 2016-12-23 HISTORY — DX: Personal history of other diseases of the respiratory system: Z87.09

## 2016-12-23 HISTORY — DX: Frequency of micturition: R35.0

## 2016-12-23 HISTORY — DX: Benign prostatic hyperplasia without lower urinary tract symptoms: N40.0

## 2016-12-23 HISTORY — DX: Unspecified osteoarthritis, unspecified site: M19.90

## 2016-12-23 HISTORY — DX: Urgency of urination: R39.15

## 2016-12-23 HISTORY — PX: CYSTOSCOPY: SHX5120

## 2016-12-23 HISTORY — DX: Diverticulosis of large intestine without perforation or abscess without bleeding: K57.30

## 2016-12-23 HISTORY — DX: Presence of spectacles and contact lenses: Z97.3

## 2016-12-23 HISTORY — DX: Personal history of irradiation: Z92.3

## 2016-12-23 HISTORY — DX: Personal history of urinary calculi: Z87.442

## 2016-12-23 SURGERY — INSERTION, RADIATION SOURCE, PROSTATE
Anesthesia: General

## 2016-12-23 MED ORDER — ONDANSETRON HCL 4 MG/2ML IJ SOLN
INTRAMUSCULAR | Status: AC
  Filled 2016-12-23: qty 2

## 2016-12-23 MED ORDER — MIDAZOLAM HCL 2 MG/2ML IJ SOLN
INTRAMUSCULAR | Status: AC
  Filled 2016-12-23: qty 2

## 2016-12-23 MED ORDER — KETOROLAC TROMETHAMINE 30 MG/ML IJ SOLN
INTRAMUSCULAR | Status: DC | PRN
  Administered 2016-12-23: 30 mg via INTRAVENOUS

## 2016-12-23 MED ORDER — FENTANYL CITRATE (PF) 100 MCG/2ML IJ SOLN
INTRAMUSCULAR | Status: DC | PRN
  Administered 2016-12-23 (×2): 25 ug via INTRAVENOUS
  Administered 2016-12-23: 50 ug via INTRAVENOUS

## 2016-12-23 MED ORDER — MORPHINE SULFATE (PF) 2 MG/ML IV SOLN
2.0000 mg | INTRAVENOUS | Status: DC | PRN
  Filled 2016-12-23: qty 1

## 2016-12-23 MED ORDER — MIDAZOLAM HCL 5 MG/5ML IJ SOLN
INTRAMUSCULAR | Status: DC | PRN
  Administered 2016-12-23: 2 mg via INTRAVENOUS

## 2016-12-23 MED ORDER — SODIUM CHLORIDE 0.9 % IV SOLN
250.0000 mL | INTRAVENOUS | Status: DC | PRN
  Filled 2016-12-23: qty 250

## 2016-12-23 MED ORDER — ACETAMINOPHEN 10 MG/ML IV SOLN
INTRAVENOUS | Status: AC
  Filled 2016-12-23: qty 100

## 2016-12-23 MED ORDER — PROMETHAZINE HCL 25 MG/ML IJ SOLN
6.2500 mg | INTRAMUSCULAR | Status: DC | PRN
  Filled 2016-12-23: qty 1

## 2016-12-23 MED ORDER — HYDROCODONE-ACETAMINOPHEN 5-325 MG PO TABS: 1.0000 | ORAL_TABLET | Freq: Four times a day (QID) | ORAL | 0 refills | Status: DC | PRN

## 2016-12-23 MED ORDER — HYDROMORPHONE HCL 1 MG/ML IJ SOLN
0.2500 mg | INTRAMUSCULAR | Status: DC | PRN
Start: 2016-12-23 — End: 2016-12-23
  Filled 2016-12-23: qty 0.5

## 2016-12-23 MED ORDER — CIPROFLOXACIN IN D5W 400 MG/200ML IV SOLN
INTRAVENOUS | Status: AC
Start: 2016-12-23 — End: 2016-12-23
  Filled 2016-12-23: qty 200

## 2016-12-23 MED ORDER — SODIUM CHLORIDE 0.9% FLUSH
3.0000 mL | Freq: Two times a day (BID) | INTRAVENOUS | Status: DC
  Filled 2016-12-23: qty 3

## 2016-12-23 MED ORDER — ACETAMINOPHEN 325 MG PO TABS
650.0000 mg | ORAL_TABLET | ORAL | Status: DC | PRN
  Filled 2016-12-23: qty 2

## 2016-12-23 MED ORDER — DEXAMETHASONE SODIUM PHOSPHATE 10 MG/ML IJ SOLN
INTRAMUSCULAR | Status: AC
  Filled 2016-12-23: qty 1

## 2016-12-23 MED ORDER — FENTANYL CITRATE (PF) 100 MCG/2ML IJ SOLN
INTRAMUSCULAR | Status: AC
  Filled 2016-12-23: qty 2

## 2016-12-23 MED ORDER — KETOROLAC TROMETHAMINE 30 MG/ML IJ SOLN
INTRAMUSCULAR | Status: AC
  Filled 2016-12-23: qty 1

## 2016-12-23 MED ORDER — ACETAMINOPHEN 650 MG RE SUPP
650.0000 mg | RECTAL | Status: DC | PRN
  Filled 2016-12-23: qty 1

## 2016-12-23 MED ORDER — CIPROFLOXACIN IN D5W 400 MG/200ML IV SOLN
400.0000 mg | INTRAVENOUS | Status: AC
  Administered 2016-12-23: 400 mg via INTRAVENOUS
  Filled 2016-12-23: qty 200

## 2016-12-23 MED ORDER — OXYCODONE HCL 5 MG PO TABS
5.0000 mg | ORAL_TABLET | ORAL | Status: DC | PRN
  Filled 2016-12-23: qty 2

## 2016-12-23 MED ORDER — SODIUM CHLORIDE 0.9% FLUSH
3.0000 mL | INTRAVENOUS | Status: DC | PRN
  Filled 2016-12-23: qty 3

## 2016-12-23 MED ORDER — SULFAMETHOXAZOLE-TRIMETHOPRIM 800-160 MG PO TABS: 1.0000 | ORAL_TABLET | Freq: Two times a day (BID) | ORAL | 0 refills | Status: DC

## 2016-12-23 MED ORDER — ACETAMINOPHEN 10 MG/ML IV SOLN
INTRAVENOUS | Status: DC | PRN
  Administered 2016-12-23: 1000 mg via INTRAVENOUS

## 2016-12-23 MED ORDER — PROPOFOL 10 MG/ML IV BOLUS
INTRAVENOUS | Status: DC | PRN
  Administered 2016-12-23: 200 mg via INTRAVENOUS

## 2016-12-23 MED ORDER — DEXAMETHASONE SODIUM PHOSPHATE 4 MG/ML IJ SOLN
INTRAMUSCULAR | Status: DC | PRN
  Administered 2016-12-23: 10 mg via INTRAVENOUS

## 2016-12-23 MED ORDER — LIDOCAINE 2% (20 MG/ML) 5 ML SYRINGE
INTRAMUSCULAR | Status: DC | PRN
Start: 2016-12-23 — End: 2016-12-23
  Administered 2016-12-23: 100 mg via INTRAVENOUS

## 2016-12-23 MED ORDER — ONDANSETRON HCL 4 MG/2ML IJ SOLN
INTRAMUSCULAR | Status: DC | PRN
  Administered 2016-12-23: 4 mg via INTRAVENOUS

## 2016-12-23 MED ORDER — FLEET ENEMA 7-19 GM/118ML RE ENEM
1.0000 | ENEMA | Freq: Once | RECTAL | Status: DC
  Filled 2016-12-23: qty 1

## 2016-12-23 MED ORDER — LACTATED RINGERS IV SOLN
INTRAVENOUS | Status: DC
  Administered 2016-12-23 (×2): via INTRAVENOUS
  Filled 2016-12-23: qty 1000

## 2016-12-23 MED ORDER — KETOROLAC TROMETHAMINE 30 MG/ML IJ SOLN
30.0000 mg | Freq: Once | INTRAMUSCULAR | Status: DC | PRN
  Filled 2016-12-23: qty 1

## 2016-12-23 MED ORDER — IOHEXOL 300 MG/ML  SOLN
INTRAMUSCULAR | Status: DC | PRN
  Administered 2016-12-23: 7 mL

## 2016-12-23 MED ORDER — PROPOFOL 10 MG/ML IV BOLUS
INTRAVENOUS | Status: AC
  Filled 2016-12-23: qty 20

## 2016-12-23 MED ORDER — LIDOCAINE 2% (20 MG/ML) 5 ML SYRINGE
INTRAMUSCULAR | Status: AC
  Filled 2016-12-23: qty 5

## 2016-12-23 SURGICAL SUPPLY — 32 items
BAG URINE DRAINAGE (UROLOGICAL SUPPLIES) ×3 IMPLANT
BLADE CLIPPER SURG (BLADE) ×3 IMPLANT
CATH FOLEY 2WAY SLVR  5CC 16FR (CATHETERS) ×1
CATH FOLEY 2WAY SLVR 5CC 16FR (CATHETERS) ×2 IMPLANT
CATH ROBINSON RED A/P 20FR (CATHETERS) ×3 IMPLANT
CLOTH BEACON ORANGE TIMEOUT ST (SAFETY) ×3 IMPLANT
COVER BACK TABLE 60X90IN (DRAPES) ×3 IMPLANT
COVER MAYO STAND STRL (DRAPES) ×3 IMPLANT
DRSG TEGADERM 4X4.75 (GAUZE/BANDAGES/DRESSINGS) ×3 IMPLANT
DRSG TEGADERM 8X12 (GAUZE/BANDAGES/DRESSINGS) ×3 IMPLANT
GAUZE SPONGE 4X4 12PLY STRL LF (GAUZE/BANDAGES/DRESSINGS) ×1 IMPLANT
GLOVE BIO SURGEON STRL SZ7.5 (GLOVE) ×12 IMPLANT
GLOVE ECLIPSE 8.0 STRL XLNG CF (GLOVE) IMPLANT
GLOVE SURG SS PI 8.0 STRL IVOR (GLOVE) ×6 IMPLANT
GOWN STRL REUS W/ TWL LRG LVL3 (GOWN DISPOSABLE) ×2 IMPLANT
GOWN STRL REUS W/ TWL XL LVL3 (GOWN DISPOSABLE) ×2 IMPLANT
GOWN STRL REUS W/TWL LRG LVL3 (GOWN DISPOSABLE) ×3
GOWN STRL REUS W/TWL XL LVL3 (GOWN DISPOSABLE) ×3
HOLDER FOLEY CATH W/STRAP (MISCELLANEOUS) ×2 IMPLANT
IMPL SPACEOAR SYSTEM 10ML (MISCELLANEOUS) IMPLANT
IMPLANT SPACEOAR SYSTEM 10ML (MISCELLANEOUS) ×3
IV NS 1000ML (IV SOLUTION) ×3
IV NS 1000ML BAXH (IV SOLUTION) ×2 IMPLANT
KIT RM TURNOVER CYSTO AR (KITS) ×3 IMPLANT
MANIFOLD NEPTUNE II (INSTRUMENTS) IMPLANT
PACK CYSTO (CUSTOM PROCEDURE TRAY) ×3 IMPLANT
SYRINGE 10CC LL (SYRINGE) ×3 IMPLANT
TUBE CONNECTING 12X1/4 (SUCTIONS) IMPLANT
UNDERPAD 30X30 INCONTINENT (UNDERPADS AND DIAPERS) ×6 IMPLANT
WATER STERILE IRR 3000ML UROMA (IV SOLUTION) ×3 IMPLANT
WATER STERILE IRR 500ML POUR (IV SOLUTION) ×3 IMPLANT
select seed I -125 ×1 IMPLANT

## 2016-12-23 NOTE — Interval H&P Note (Signed)
History and Physical Interval Note:  12/23/2016 6:59 AM  Jeffrey Goodman  has presented today for surgery, with the diagnosis of PROSTATE CANCER  The various methods of treatment have been discussed with the patient and family. After consideration of risks, benefits and other options for treatment, the patient has consented to  Procedure(s): RADIOACTIVE SEED IMPLANT/BRACHYTHERAPY IMPLANT (N/A) as a surgical intervention .  The patient's history has been reviewed, patient examined, no change in status, stable for surgery.  I have reviewed the patient's chart and labs.  Questions were answered to the patient's satisfaction.     Twilia Yaklin J

## 2016-12-23 NOTE — Discharge Instructions (Addendum)
Brachytherapy for Prostate Cancer, Care After Refer to this sheet in the next few weeks. These instructions provide you with information on caring for yourself after your procedure. Your health care provider may also give you more specific instructions. Your treatment has been planned according to current medical practices, but problems sometimes occur. Call your health care provider if you have any problems or questions after your procedure. What can I expect after the procedure? The area behind the scrotum will probably be tender and bruised. For a short period of time you may have:  Difficulty passing urine. You may need a catheter for a few days to a month.  Blood in the urine or semen.  A feeling of constipation because of prostate swelling.  Frequent feeling of an urgent need to urinate. For a long period of time you may have:  Inflammation of the rectum. This happens in about 2% of people who have the procedure.  Erection problems. These vary with age and occur in about 15-40% of men.  Difficulty urinating. This is caused by scarring in the urethra.  Diarrhea. Follow these instructions at home:  Take medicines only as directed by your health care provider.  Keep all follow-up visits as directed by your health care provider. If you have a catheter, it will be removed during one of these visits.  Try not to sit directly on the area behind the scrotum. A soft cushion can decrease the discomfort. Ice packs may also be helpful for the discomfort. Do not put ice directly on the skin.  Shower and wash the area behind the scrotum gently. Do not sit in a tub.  If you have had the brachytherapy that uses the seeds, limit your close contact with children and pregnant women for 2 months because of the radiation still in the prostate. After that period of time, the levels drop off quickly. Get help right away if:  You have a fever.  You have chills.  You have shortness of  breath.  You have chest pain.  Difficulty urinating.  There is excessive bleeding from your rectum. It is normal to have a little blood mixed with your stool.  There is severe discomfort in the treated area that does not go away with pain medicine.  You have abdominal discomfort.  You have severe nausea or vomiting.  You develop any new or unusual symptoms. This information is not intended to replace advice given to you by your health care provider. Make sure you discuss any questions you have with your health care provider. Document Released: 09/27/2010 Document Revised: 02/06/2016 Document Reviewed: 02/15/2013 Elsevier Interactive Patient Education  2017 [place] [facility] Care Instructions  Activity: Get plenty of rest for the remainder of the day. A responsible individual must stay with you for 24 hours following the procedure.  For the next 24 hours, DO NOT: -Drive a car -Paediatric nurse -Drink alcoholic beverages -Take any medication unless instructed by your physician -Make any legal decisions or sign important papers.  Meals: Start with liquid foods such as gelatin or soup. Progress to regular foods as tolerated. Avoid greasy, spicy, heavy foods. If nausea and/or vomiting occur, drink only clear liquids until the nausea and/or vomiting subsides. Call your physician if vomiting continues.  Special Instructions/Symptoms: Your throat may feel dry or sore from the anesthesia or the breathing tube placed in your throat during surgery. If this causes discomfort, gargle with warm salt water. The discomfort should disappear within 24  hours.  If you had a scopolamine patch placed behind your ear for the management of post- operative nausea and/or vomiting:  1. The medication in the patch is effective for 72 hours, after which it should be removed.  Wrap patch in a tissue and discard in the trash. Wash hands thoroughly with soap and water. 2. You may remove  the patch earlier than 72 hours if you experience unpleasant side effects which may include dry mouth, dizziness or visual disturbances. 3. Avoid touching the patch. Wash your hands with soap and water after contact with the patch.

## 2016-12-23 NOTE — Op Note (Signed)
PATIENT:  Claudette Head  PRE-OPERATIVE DIAGNOSIS:  Adenocarcinoma of the prostate  POST-OPERATIVE DIAGNOSIS:  Same  PROCEDURE:  Procedure(s): 1. I-125 radioactive seed implantation 2.  SpaceOAR implant, 3. Cystoscopy  SURGEON:  Surgeon(s): Irine Seal MD  Radiation oncologist: Dr. Tyler Pita  ANESTHESIA:  General  EBL:  Minimal  DRAINS: 10 French Foley catheter  INDICATION: Jeffrey Goodman is a 66 y.o. with Stage T2aN0M0, Gleason 9(4+5) prostate cancer who has elected a brachytherapy boost  in addition to adjuvant ADT and EXRT.  Description of procedure: After informed consent the patient was brought to the major OR, placed on the table and administered general anesthesia. He was then moved to the modified lithotomy position with his perineum perpendicular to the floor. His perineum and genitalia were then sterilely prepped. An official timeout was then performed. A 16 French Foley catheter was then placed in the bladder and filled with dilute contrast, a rectal tube was placed in the rectum and the transrectal ultrasound probe was placed in the rectum and affixed to the stand. He was then sterilely draped.  The sterile grid was installed.   Anchor needles were then placed.   Real time ultrasonography was used along with the seed planning software spot-pro version 3.1-00. This was used to develop the seed plan including the number of needles as well as number of seeds required for complete and adequate coverage. Real-time ultrasonography was then used along with the previously developed plan and the Nucletron device to implant a total of 55 seeds using 20 needles for a target dose of 145 Gy. This proceeded without difficulty or complication.  He then underwent injection of 3ml of SpaceOAR gel into the perirectal fat posterior to the prostate using an 18g needle.  US imaging confirmed a successful injection.   The Foley catheter was then removed as well as the transrectal  ultrasound probe and rectal catheter Flexible cystoscopy was then performed using the 17 French flexible scope which revealed a normal urethra throughout its length down to the sphincter which appeared intact. The prostatic urethra was 2-3cm with bilobar hyperplasia and minimal obstruction. The bladder was then entered and fully and systematically inspected.  The ureteral orifices were noted to be of normal configuration and position. The mucosa revealed no evidence of tumors. There were also no stones identified within the bladder.  No seeds or spacers were seen and/or removed from the bladder.  The cystoscope was then removed.  The drapes were removed.  The perineum was cleaned and dressed.  He was taken out of the lithotomy position and was awakened and taken to recovery room in stable and satisfactory condition. He tolerated procedure well and there were no intraoperative complications.

## 2016-12-23 NOTE — Transfer of Care (Signed)
Immediate Anesthesia Transfer of Care Note  Patient: Jeffrey Goodman  Procedure(s) Performed: Procedure(s) (LRB): RADIOACTIVE SEED IMPLANT/BRACHYTHERAPY IMPLANT and space oar placement (N/A) CYSTOSCOPY FLEXIBLE  Patient Location: PACU  Anesthesia Type: General  Level of Consciousness: awake, oriented, sedated and patient cooperative  Airway & Oxygen Therapy: Patient Spontanous Breathing and Patient connected to face mask oxygen  Post-op Assessment: Report given to PACU RN and Post -op Vital signs reviewed and stable  Post vital signs: Reviewed and stable  Complications: No apparent anesthesia complications  Last Vitals:  Vitals:   12/23/16 0556 12/23/16 0931  BP: 128/75 140/79  Pulse: (!) 51 (!) 56  Resp: 16 10  Temp: 37.2 C

## 2016-12-23 NOTE — Anesthesia Postprocedure Evaluation (Addendum)
Anesthesia Post Note  Patient: Jeffrey Goodman  Procedure(s) Performed: Procedure(s) (LRB): RADIOACTIVE SEED IMPLANT/BRACHYTHERAPY IMPLANT and space oar placement (N/A) CYSTOSCOPY FLEXIBLE  Patient location during evaluation: PACU Anesthesia Type: General Level of consciousness: awake and alert Pain management: pain level controlled Vital Signs Assessment: post-procedure vital signs reviewed and stable Respiratory status: spontaneous breathing, nonlabored ventilation, respiratory function stable and patient connected to nasal cannula oxygen Cardiovascular status: blood pressure returned to baseline and stable Postop Assessment: no signs of nausea or vomiting Anesthetic complications: no       Last Vitals:  Vitals:   12/23/16 0931 12/23/16 0945  BP: 140/79 133/83  Pulse: (!) 56 (!) 56  Resp: 10 18  Temp: 36.1 C     Last Pain:  Vitals:   12/23/16 0556  TempSrc: Oral                 Muath Hallam S

## 2016-12-23 NOTE — Anesthesia Procedure Notes (Signed)
Procedure Name: LMA Insertion Date/Time: 12/23/2016 7:51 AM Performed by: Denna Haggard D Pre-anesthesia Checklist: Patient identified, Emergency Drugs available, Suction available and Patient being monitored Patient Re-evaluated:Patient Re-evaluated prior to inductionOxygen Delivery Method: Circle system utilized Preoxygenation: Pre-oxygenation with 100% oxygen Intubation Type: IV induction Ventilation: Mask ventilation without difficulty LMA: LMA inserted LMA Size: 4.0 Number of attempts: 1 Airway Equipment and Method: Bite block Placement Confirmation: positive ETCO2 Tube secured with: Tape Dental Injury: Teeth and Oropharynx as per pre-operative assessment

## 2016-12-24 ENCOUNTER — Encounter (HOSPITAL_BASED_OUTPATIENT_CLINIC_OR_DEPARTMENT_OTHER): Payer: Self-pay | Admitting: Urology

## 2016-12-24 NOTE — Progress Notes (Signed)
  Radiation Oncology         (336) [phone] ________________________________  Name: Jeffrey Goodman MRN: [id number]  Date: 12/24/2016  DOB: [birth year]       Prostate Seed Implant  PR:FFMBW,GYKZLD DAVIDSON, MD  No ref. provider found  DIAGNOSIS: 66 y.o. 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 Prostate cancer ([place]) 185 C61 DG Chest 2 View     DG Chest 2 View    PROCEDURE: Insertion of radioactive I-125 seeds into the prostate gland.  RADIATION DOSE: 110 Gy, boost therapy.  TECHNIQUE: FARREN NELLES was brought to the operating room with the urologist. He was placed in the dorsolithotomy position. He was catheterized and a rectal tube was inserted. The perineum was shaved, prepped and draped. The ultrasound probe was then introduced into the rectum to see the prostate gland.  TREATMENT DEVICE: A needle grid was attached to the ultrasound probe stand and anchor needles were placed.  3D PLANNING: The prostate was imaged in 3D using a sagittal sweep of the prostate probe. These images were transferred to the planning computer. There, the prostate, urethra and rectum were defined on each axial reconstructed image. Then, the software created an optimized 3D plan and a few seed positions were adjusted. The quality of the plan was reviewed using [facility] information for the target and the following two organs at risk:  Urethra and Rectum.  Then the accepted plan was uploaded to the seed Selectron afterloading unit.  PROSTATE VOLUME STUDY:  Using transrectal ultrasound the volume of the prostate was verified to be 32.2 cc.  SPECIAL TREATMENT PROCEDURE/SUPERVISION AND HANDLING: The Nucletron FIRST system was used to place the needles under sagittal guidance. A total of 20 needles were used to deposit 55 seeds in the prostate gland. The individual seed activity was 0.372 mCi.  COMPLEX SIMULATION: At the end  of the procedure, an anterior radiograph of the pelvis was obtained to document seed positioning and count. Cystoscopy was performed to check the urethra and bladder.  MICRODOSIMETRY: At the end of the procedure, the patient was emitting 0.11 mR/hr at 1 meter. Accordingly, he was considered safe for hospital discharge.  PLAN: The patient will return to the radiation oncology clinic for post implant CT dosimetry in three weeks.   ________________________________  Sheral Apley Tammi Klippel, M.D.

## 2017-01-14 ENCOUNTER — Telehealth: Payer: Self-pay | Admitting: *Deleted

## 2017-01-14 NOTE — Telephone Encounter (Signed)
Called patient to remind of appts. For 01-15-17, lvm for a return call

## 2017-01-15 ENCOUNTER — Ambulatory Visit
Admission: RE | Admit: 2017-01-15 | Discharge: 2017-01-15 | Disposition: A | Discharge status: Home / Self Care | Payer: BLUE CROSS/BLUE SHIELD | Source: Ambulatory Visit | Attending: Radiation Oncology | Admitting: Radiation Oncology

## 2017-01-15 ENCOUNTER — Ambulatory Visit (HOSPITAL_COMMUNITY)
Admission: RE | Admit: 2017-01-15 | Discharge: 2017-01-15 | Disposition: A | Discharge status: Home / Self Care | Payer: BLUE CROSS/BLUE SHIELD | Source: Ambulatory Visit | Attending: Urology | Admitting: Urology

## 2017-01-15 ENCOUNTER — Encounter: Payer: Self-pay | Admitting: Medical Oncology

## 2017-01-15 VITALS — BP 131/95 | HR 64 | Temp 98.3°F | Resp 18

## 2017-01-15 DIAGNOSIS — Y842 Radiological procedure and radiotherapy as the cause of abnormal reaction of the patient, or of later complication, without mention of misadventure at the time of the procedure: Secondary | ICD-10-CM | POA: Diagnosis not present

## 2017-01-15 DIAGNOSIS — R300 Dysuria: Secondary | ICD-10-CM | POA: Insufficient documentation

## 2017-01-15 DIAGNOSIS — C61 Malignant neoplasm of prostate: Secondary | ICD-10-CM

## 2017-01-15 DIAGNOSIS — Z79899 Other long term (current) drug therapy: Secondary | ICD-10-CM | POA: Insufficient documentation

## 2017-01-15 NOTE — Progress Notes (Signed)
Radiation Oncology         [phone] ________________________________  Name: Jeffrey Goodman MRN: [id number]  Date: 01/15/2017  DOB: 1951/04/18  Follow-Up Visit Note  CC: Deland Pretty, MD  Raynelle Bring, MD  Diagnosis:   66 y.o. gentleman with stage T2aadenocarcinoma of the prostate with a Gleason's score of 4+5=9and a PSA of 13.79    ICD-9-CM ICD-10-CM   1. Malignant neoplasm of prostate ([place]) 185 C61     Interval Since Last Radiation:  1 months  12/23/16: Insertion of radioactive I-125 seeds into the prostate gland. 110 Gy, boost therapy.  10/29/16-12/02/16: 1) Prostate / 45 Gy in 25 fractions 2) Seed Boost / 110 Gy in 1 fraction planned 12/23/16  Narrative:  The patient returns today for routine follow-up.  He is complaining of increased urinary frequency, urgency, weak stream and intermittent dysuria. He filled out a questionnaire regarding urinary function today providing and overall IPSS score of 14 characterizing his symptoms as moderate. His pre-implant score was 9. He denies incontinence, diarrhea, or constipation. He reports hot flashes and fatigue associated with ADT.  He denies any decreased appetite, abdominal pain, nausea or vomiting.  ALLERGIES:  has No Known Allergies.  Meds: Current Outpatient Prescriptions  Medication Sig Dispense Refill  . acetaminophen (TYLENOL) 500 MG tablet Take 1,000 mg by mouth every 6 (six) hours as needed for pain.    Marland Kitchen amLODipine (NORVASC) 10 MG tablet Take 10 mg by mouth every morning.   99  . diphenhydrAMINE (BENADRYL) 25 mg capsule Take 50 mg by mouth every 6 (six) hours as needed for itching or allergies.     Marland Kitchen HYDROcodone-acetaminophen (NORCO) 5-325 MG tablet Take 1 tablet by mouth every 6 (six) hours as needed for moderate pain. 8 tablet 0  . metoprolol tartrate (LOPRESSOR) 25 MG tablet Take 25 mg by mouth 2 (two) times daily. 25mg  in am /  12.5 in pm    . NON FORMULARY Immutol - 1500 mg BID.    Marland Kitchen oxymetazoline (AFRIN)  0.05 % nasal spray Place 1 spray into the nose 2 (two) times daily as needed.     . sulfamethoxazole-trimethoprim (BACTRIM DS,SEPTRA DS) 800-160 MG tablet Take 1 tablet by mouth 2 (two) times daily. 6 tablet 0  . valsartan-hydrochlorothiazide (DIOVAN-HCT) 320-12.5 MG tablet Take 1 tablet by mouth every morning.   3   No current facility-administered medications for this encounter.     Physical Findings: The patient is in no acute distress. Patient is alert and oriented.  oral temperature is 98.3 F (36.8 C). His blood pressure is 131/95 (abnormal) and his pulse is 64. His respiration is 18 and oxygen saturation is 100%. .  No significant changes.  Lab Findings: Lab Results  Component Value Date   WBC 3.7 (L) 12/16/2016   HGB 14.2 12/16/2016   HCT 39.4 12/16/2016   MCV 89.7 12/16/2016   PLT 182 12/16/2016    Radiographic Findings:  Patient underwent CT imaging in our clinic for post implant dosimetry. The CT appears to demonstrate an adequate distribution of radioactive seeds throughout the prostate gland. There no seeds in her near the rectum. I suspect the final radiation plan and dosimetry will show appropriate coverage of the prostate gland.   Impression: The patient is recovering from the effects of radiation. His urinary symptoms should gradually improve over the next 4-6 months. We talked about this today. He is encouraged by his improvement already and is otherwise please with his outcome.  Plan: Today,weI spent time talking to the patient about his prostate seed implant and resolving urinary symptoms. We also talked about long-term follow-up for prostate cancer following seed implant. He understands that ongoing PSA determinations and digital rectal exams with Urology will help perform surveillance to rule out disease recurrence. He understands what to expect with his PSA measures. He has a follow up appointment next month with Dr. Jeffie Pollock and anticipates completing a 2 year course  of ADT.  Patient was also educated today about some of the long-term effects from radiation including a small risk for rectal bleeding and possibly erectile dysfunction. We talked about some of the general management approaches to these potential complications. However, I did encourage the patient to contact our office or return at any point if he has questions or concerns related to his previous radiation and prostate cancer. The patient is scheduled for an MRI of the pelvis today at [facility] for verification of SpaceOar distribution. _____________________________________   Nicholos Johns, PA-C    Tyler Pita, MD  [facility] Direct Dial: [phone]  Fax: [phone] .com  Skype  LinkedIn  This document serves as a record of services personally performed by Freeman Caldron, PA-C and Tyler Pita, MD. It was created on their behalf by Darcus Austin, a trained medical scribe. The creation of this record is based on the scribe's personal observations and the providers' statements to them. This document has been checked and approved by the attending provider.

## 2017-01-15 NOTE — Progress Notes (Signed)
Jeffrey Goodman doing well post radiation and seed implant.He states he is fatigued and has some burning with urination. We discussed that the fatigue should start getting better in the weeks to come. He also received Lupron which causes fatigue. I asked him to call me with concerns. He voiced understanding.

## 2017-01-15 NOTE — Progress Notes (Signed)
  Radiation Oncology         [phone]) [phone] ________________________________  Name: Jeffrey Goodman MRN: [id number]  Date: 01/15/2017  DOB: [birth year]  COMPLEX SIMULATION NOTE  NARRATIVE:  The patient was brought to the [place] suite today following prostate seed implantation approximately one month ago.  Identity was confirmed.  All relevant records and images related to the planned course of therapy were reviewed.  Then, the patient was set-up supine.  CT images were obtained.  The CT images were loaded into the planning software.  An MRI was performed and fused to see the [place]. Then the prostate and rectum were contoured.  Treatment planning then occurred.  The implanted iodine 125 seeds were identified by the physics staff for projection of radiation distribution  I have requested : 3D Simulation  I have requested a DVH of the following structures: Prostate and rectum. ________________________________  Sheral Apley Tammi Klippel, M.D.  This document serves as a record of services personally performed by Tyler Pita, MD. It was created on his behalf by Darcus Austin, a trained medical scribe. The creation of this record is based on the scribe's personal observations and the provider's statements to them. This document has been checked and approved by the attending provider.

## 2017-01-15 NOTE — Progress Notes (Signed)
Jeffrey Goodman is here today for post seed follow up following CT simulation.  Patient completed IPSS form.  Patient denies any pain.  He does report some burning/pain though with urination.  Patient denies any increased frequency, incontinence, diarrhea or constipation.  Vitals:   01/15/17 1313  BP: (!) 131/95  Pulse: 64  Resp: 18  Temp: 98.3 F (36.8 C)  TempSrc: Oral  SpO2: 100%    Wt Readings from Last 3 Encounters:  12/23/16 222 lb (100.7 kg)  11/06/16 227 lb 12.8 oz (103.3 kg)  10/31/16 226 lb 6.4 oz (102.7 kg)

## 2017-01-22 ENCOUNTER — Encounter: Payer: Self-pay | Admitting: Radiation Oncology

## 2017-01-22 DIAGNOSIS — C61 Malignant neoplasm of prostate: Secondary | ICD-10-CM | POA: Diagnosis not present

## 2017-01-26 NOTE — Progress Notes (Signed)
  Radiation Oncology         [phone] ________________________________  Name: Jeffrey Goodman MRN: [id number]  Date: 01/22/2017  DOB: 1951-08-28  3D Planning Note   Prostate Brachytherapy Post-Implant Dosimetry  Diagnosis: 66 y.o. gentleman with stage T2aadenocarcinoma of the prostate with a Gleason's score of 4+5=9and a PSA of 13.79  Narrative: On a previous date, Jeffrey Goodman returned following prostate seed implantation for post implant planning. He underwent CT scan complex simulation to delineate the three-dimensional structures of the pelvis and demonstrate the radiation distribution.  Since that time, the seed localization, and complex isodose planning with dose volume histograms have now been completed.  Results:   Prostate Coverage - The dose of radiation delivered to the 90% or more of the prostate gland (D90) was 110.64% of the prescription dose. This exceeds our goal of greater than 90%. Rectal Sparing - The volume of rectal tissue receiving the prescription dose or higher was 0.0 cc. This falls under our thresholds tolerance of 1.0 cc. Rectal sparing was enhanced by SpaceOAR placement  Impression: The prostate seed implant appears to show adequate target coverage and appropriate rectal sparing.  Plan:  The patient will continue to follow with urology for ongoing PSA determinations. I would anticipate a high likelihood for local tumor control with minimal risk for rectal morbidity.  ________________________________  Sheral Apley Tammi Klippel, M.D.

## 2017-01-29 NOTE — Addendum Note (Signed)
Encounter addended by: Heywood Footman, RN on: 01/29/2017 11:08 AM<BR>    Actions taken: Charge Capture section accepted

## 2017-02-09 NOTE — Addendum Note (Signed)
Addendum  created 02/09/17 1325 by Myrtie Soman, MD   Sign clinical note

## 2017-02-11 ENCOUNTER — Telehealth: Payer: Self-pay | Admitting: Radiation Oncology

## 2017-02-11 NOTE — Telephone Encounter (Signed)
-----   Message from Freeman Caldron, [place] sent at 02/10/2017  5:19 PM EDT ----- Regarding: RE: SURGERY He is 2 months out from his seed implant so he is fine to proceed with scheduling his arthroplasty, per Dr. Tammi Klippel.  Ailene Ards ----- Message ----- From: Heywood Footman, RN Sent: 02/10/2017   9:47 AM To: Freeman Caldron, PA-C Subject: FW: SURGERY                                    [name].  How long should he wait between his prostate implant and an arthroplasty?  [name] ----- Message ----- From: Kerri Perches Sent: 02/10/2017   9:39 AM To: Heywood Footman, RN Subject: SURGERY                                        Hi [name],   This patient is going to have a procedure (arthroplasty), he had an implant and he is wanting to know how long he should wait before he allows them to schedule this surgery.  Jeffrey Goodman phone number is [phone].   Thanks,  [employer]

## 2017-02-11 NOTE — Telephone Encounter (Signed)
Phoned patient. No answer. Left message on voicemail detailing that he is OK to schedule his arthroplasty since he is two months out from his prostate seed implant. Left my contact information should the patient having any additional questions.

## 2017-03-26 ENCOUNTER — Inpatient Hospital Stay: Admit: 2017-03-26 | Payer: BLUE CROSS/BLUE SHIELD | Admitting: Orthopedic Surgery

## 2017-03-26 SURGERY — ARTHROPLASTY, SHOULDER, TOTAL
Anesthesia: General | Site: Shoulder | Laterality: Right

## 2018-04-14 DIAGNOSIS — C61 Malignant neoplasm of prostate: Secondary | ICD-10-CM | POA: Diagnosis not present

## 2018-04-21 DIAGNOSIS — R351 Nocturia: Secondary | ICD-10-CM | POA: Diagnosis not present

## 2018-04-21 DIAGNOSIS — C61 Malignant neoplasm of prostate: Secondary | ICD-10-CM | POA: Diagnosis not present

## 2018-04-21 DIAGNOSIS — N403 Nodular prostate with lower urinary tract symptoms: Secondary | ICD-10-CM | POA: Diagnosis not present

## 2018-04-28 DIAGNOSIS — Z8546 Personal history of malignant neoplasm of prostate: Secondary | ICD-10-CM | POA: Diagnosis not present

## 2018-04-28 DIAGNOSIS — R5383 Other fatigue: Secondary | ICD-10-CM | POA: Diagnosis not present

## 2018-04-28 DIAGNOSIS — M542 Cervicalgia: Secondary | ICD-10-CM | POA: Diagnosis not present

## 2018-04-29 DIAGNOSIS — R5383 Other fatigue: Secondary | ICD-10-CM | POA: Diagnosis not present

## 2018-06-09 DIAGNOSIS — H2513 Age-related nuclear cataract, bilateral: Secondary | ICD-10-CM | POA: Diagnosis not present

## 2018-06-09 DIAGNOSIS — H35341 Macular cyst, hole, or pseudohole, right eye: Secondary | ICD-10-CM | POA: Diagnosis not present

## 2018-06-17 DIAGNOSIS — H2511 Age-related nuclear cataract, right eye: Secondary | ICD-10-CM | POA: Diagnosis not present

## 2018-06-17 DIAGNOSIS — H35341 Macular cyst, hole, or pseudohole, right eye: Secondary | ICD-10-CM | POA: Diagnosis not present

## 2018-06-17 DIAGNOSIS — H2512 Age-related nuclear cataract, left eye: Secondary | ICD-10-CM | POA: Diagnosis not present

## 2018-06-18 DIAGNOSIS — I10 Essential (primary) hypertension: Secondary | ICD-10-CM | POA: Diagnosis not present

## 2018-06-18 DIAGNOSIS — Z0000 Encounter for general adult medical examination without abnormal findings: Secondary | ICD-10-CM | POA: Diagnosis not present

## 2018-06-18 DIAGNOSIS — Z125 Encounter for screening for malignant neoplasm of prostate: Secondary | ICD-10-CM | POA: Diagnosis not present

## 2018-06-24 DIAGNOSIS — D72819 Decreased white blood cell count, unspecified: Secondary | ICD-10-CM | POA: Diagnosis not present

## 2018-06-24 DIAGNOSIS — Z0000 Encounter for general adult medical examination without abnormal findings: Secondary | ICD-10-CM | POA: Diagnosis not present

## 2018-06-24 DIAGNOSIS — K5730 Diverticulosis of large intestine without perforation or abscess without bleeding: Secondary | ICD-10-CM | POA: Diagnosis not present

## 2018-06-24 DIAGNOSIS — I10 Essential (primary) hypertension: Secondary | ICD-10-CM | POA: Diagnosis not present

## 2018-07-01 DIAGNOSIS — H35341 Macular cyst, hole, or pseudohole, right eye: Secondary | ICD-10-CM | POA: Diagnosis not present

## 2018-07-07 DIAGNOSIS — H35341 Macular cyst, hole, or pseudohole, right eye: Secondary | ICD-10-CM | POA: Diagnosis not present

## 2018-07-15 DIAGNOSIS — H35341 Macular cyst, hole, or pseudohole, right eye: Secondary | ICD-10-CM | POA: Diagnosis not present

## 2018-07-15 DIAGNOSIS — Z09 Encounter for follow-up examination after completed treatment for conditions other than malignant neoplasm: Secondary | ICD-10-CM | POA: Diagnosis not present

## 2018-09-09 DIAGNOSIS — H35341 Macular cyst, hole, or pseudohole, right eye: Secondary | ICD-10-CM | POA: Diagnosis not present

## 2018-09-09 DIAGNOSIS — Z09 Encounter for follow-up examination after completed treatment for conditions other than malignant neoplasm: Secondary | ICD-10-CM | POA: Diagnosis not present

## 2018-10-15 DIAGNOSIS — H2513 Age-related nuclear cataract, bilateral: Secondary | ICD-10-CM | POA: Diagnosis not present

## 2018-10-22 DIAGNOSIS — R7303 Prediabetes: Secondary | ICD-10-CM | POA: Diagnosis not present

## 2018-10-26 DIAGNOSIS — R7309 Other abnormal glucose: Secondary | ICD-10-CM | POA: Diagnosis not present

## 2018-10-26 DIAGNOSIS — R748 Abnormal levels of other serum enzymes: Secondary | ICD-10-CM | POA: Diagnosis not present

## 2018-11-02 DIAGNOSIS — Z8546 Personal history of malignant neoplasm of prostate: Secondary | ICD-10-CM | POA: Diagnosis not present

## 2018-11-08 DIAGNOSIS — E349 Endocrine disorder, unspecified: Secondary | ICD-10-CM | POA: Diagnosis not present

## 2018-11-08 DIAGNOSIS — Z87442 Personal history of urinary calculi: Secondary | ICD-10-CM | POA: Diagnosis not present

## 2018-11-08 DIAGNOSIS — Z8546 Personal history of malignant neoplasm of prostate: Secondary | ICD-10-CM | POA: Diagnosis not present

## 2018-11-08 DIAGNOSIS — R351 Nocturia: Secondary | ICD-10-CM | POA: Diagnosis not present

## 2019-02-24 DIAGNOSIS — H2511 Age-related nuclear cataract, right eye: Secondary | ICD-10-CM | POA: Diagnosis not present

## 2019-02-24 DIAGNOSIS — H2512 Age-related nuclear cataract, left eye: Secondary | ICD-10-CM | POA: Diagnosis not present

## 2019-02-24 DIAGNOSIS — H35341 Macular cyst, hole, or pseudohole, right eye: Secondary | ICD-10-CM | POA: Diagnosis not present

## 2019-03-25 DIAGNOSIS — Z8546 Personal history of malignant neoplasm of prostate: Secondary | ICD-10-CM | POA: Diagnosis not present

## 2019-03-25 DIAGNOSIS — N200 Calculus of kidney: Secondary | ICD-10-CM | POA: Diagnosis not present

## 2019-03-28 ENCOUNTER — Other Ambulatory Visit: Payer: Self-pay | Admitting: Urology

## 2019-03-30 NOTE — H&P (Signed)
Office Visit Report     03/25/2019   --------------------------------------------------------------------------------   Jeffrey Goodman  MRN: [id number]  DOB: June 08, 1951, 68 year old Male  SSN: -**-[id number]   PRIMARY CARE:  Deland Pretty, MD  REFERRING:  Irine Seal, MD  PROVIDER:  Irine Seal, M.D.  TREATING:  Jiles Crocker  LOCATION:  [facility] [phone]     --------------------------------------------------------------------------------   CC: I have prostate cancer.  HPI: Jeffrey Goodman is a 68 year-old male established patient who is here evaluation for treatment of prostate cancer.  March 2020: Mr. Hufstetler returns today in f/u for his T2a N0 M0 Gleason 9 prostate cancer that was found on a biopsy on 06/23/16 for a right nodule and PSA of 13.79. He elected ADT and EXRT for therapy and has received firmagon 240mg . He was last given Lupron 45mg  IM on 04/21/18 for his final dose in the 2 year sequence. He also had gold seeds placed then and 12/02/16 was his last day for XRT. He underwent brachytherapy seed placement on 12/23/16. His PSA remains <0.015. His T is 15.8. He is voiding with stable frequency and nocturia. His IPSS is 10. He has a good stream but doesn't always feel empty. He has had no hematuria. He has had no further hot flashes. He has lost 12 lbs with a new walking program. He has no other associated signs or symptoms.      He has undergone External Beam Radiation Therapy for treatment. He has undergone Hormonal Therapy for treatment.   He does not have urinary incontinence. He has not recently had unwanted weight loss. He is not having pain in new locations.     CC: I have a history of kidney stones.  HPI: 03/25/2019: Presents today for evaluation of possible obstructing KS. From prior imaging studies a few years ago he was noted to have a solitary stone in the RLP on KUB.   Acute onset of right lower back and flank pain radiating into the right  lower quadrant of the abdomen last night. Taking combination of ibuprofen and Tylenol which alleviates some of the pain but not completely resolving it. Also associated with some mild burning with urination. He is increased fluid intake and accounts for increased frequency with that but no changes in baseline urgency or force of stream. Afebrile and without nausea/vomiting.   The patient's stone was on his right side. He did not pass a stone since the last office visit.   The patient has had flank pain since they were last seen. He has no seen blood in his urine since the last visit. He is currently having flank pain and back pain. He denies having groin pain, nausea, vomiting, fever, and chills. He has had ESWL for treatment of his stones in the past.     AUA Symptom Score: Less than 50% of the time he has the sensation of not emptying his bladder completely when finished urinating. Less than 20% of the time he has to urinate again fewer than two hours after he has finished urinating. Less than 20% of the time he has to start and stop again several times when he urinates. Less than 20% of the time he finds it difficult to postpone urination. Less than 20% of the time he has a weak urinary stream. Less than 20% of the time he has to push or strain to begin urination. He has to get up to urinate 3 times from the time he  goes to bed until the time he gets up in the morning.   Calculated AUA Symptom Score: 10    ALLERGIES: No Allergies    MEDICATIONS: Amlodipine Besylate 10 mg tablet  Benadryl Allergy 25 mg tablet Oral PRN  Irbesartan-Hydrochlorothiazide 150 mg-12.5 mg tablet     GU PSH: ESWL - 2013, 2012, 2008 Locm 300-399Mg /Ml Iodine,1Ml - 2017 PLACE RT DEVICE/MARKER, PROS - 2018 Prostate Needle Biopsy - 2017 TRANSPERI NEEDLE PLACE, PROS - 2018       PSH Notes: Knee Replacement, Lithotripsy, Lithotripsy, Lithotripsy   NON-GU PSH: Knee replacement, Bilateral Revise Knee Joint -  2014 Surgical Pathology, Gross And Microscopic Examination For Prostate Needle - 2017     GU PMH: History of prostate cancer, His PSA remains undetectable but his testosterone remains castrate. f/u in 6 months with labs. - 11/08/2018, (Stable), He is doing well on current therapy and will get his last Lupron today. he will return in 11months with a PSA and testosterone level. , - 04/21/2018, His PSA is down to undetectible with ADT and RTX. His testosterone is castrate. He has dysuria but doesn't feel he needs treatment at this time. , - 2018 History of urolithiasis, He has had no hematuria or flank pain. - 11/08/2018 Prostate Cancer (Stable), His PSA is suppressed on ADT. I will have him return in 3 months with a PSA for an exam and Lupron. - 01/04/2018, His PSA remains suppressed on Lupron. He will return in 6 weeks for Lupron and see me in 4 months with a PSA and testosterone. , - 09/07/2017, - 2018, - 2018, He has a T2a N0 M0 Gleason 9 prostate cancer. , - 2017 Dysuria - 2018 Elevated PSA - 2017, He has a marked elevation of the PSA but the last I had was from 2008. , - 2017 Nocturia, He has nocturia and intermittency. - 2017 Prostate nodule w/ LUTS, He has a right prostate nodule but no extracapsular gross disease. - 2017 Renal calculus, Bilateral kidney stones - 2017, Kidney stone on right side, - 2015 Abdominal Pain Unspec, Rt flank pain - 2015 Ureteral calculus, Calculus of right ureter - 2015, Proximal Ureteral Stone On The Left, - 2014, Ureteral Stone, - 2014 Flank Pain, Diffuse Abdominal Pain - 2014      PMH Notes:  2007-07-29 14:49:23 - Note: Nephrolithiasis Of The Right Kidney  2011-07-14 10:41:06 - Note: Arthritis   NON-GU PMH: Low testosterone - 2018 Long term use of hormonal medication - 2018 Encounter for general adult medical examination without abnormal findings, Encounter for preventive health examination - 2015 Hypercalciuria, Hypercalciuria - 2014 Personal history of other  diseases of the circulatory system, History of hypertension - 2014    FAMILY HISTORY: Diabetes - Father Family Health Status Number - Runs In Family Hypertension - Father, Mother nephrolithiasis - Runs In Family Stroke Syndrome - Mother   SOCIAL HISTORY: Marital Status: Single Preferred Language: English; Ethnicity: Not Hispanic Or Latino; Race: White     Notes: Occupation:, Caffeine Use, Marital History - Single, Never A Smoker, Tobacco Use, Alcohol Use   REVIEW OF SYSTEMS:    GU Review Male:   Patient denies frequent urination, hard to postpone urination, burning/ pain with urination, get up at night to urinate, leakage of urine, trouble starting your stream, have to strain to urinate , erection problems, and penile pain.  Gastrointestinal (Upper):   Patient denies nausea, vomiting, and indigestion/ heartburn.  Gastrointestinal (Lower):   Patient denies diarrhea and constipation.  Constitutional:  Patient denies fever, night sweats, weight loss, and fatigue.  Skin:   Patient denies itching and skin rash/ lesion.  Eyes:   Patient denies blurred vision and double vision.  Ears/ Nose/ Throat:   Patient denies sore throat and sinus problems.  Hematologic/Lymphatic:   Patient denies swollen glands and easy bruising.  Cardiovascular:   Patient denies leg swelling and chest pains.  Respiratory:   Patient denies cough and shortness of breath.  Endocrine:   Patient denies excessive thirst.  Musculoskeletal:   Patient reports back pain. Patient denies joint pain.  Neurological:   Patient denies headaches and dizziness.  Psychologic:   Patient denies depression and anxiety.   VITAL SIGNS:      03/25/2019 01:58 PM  BP 137/87 mmHg  Pulse 97 /min  Temperature 96.9 F / 36.0 C   MULTI-SYSTEM PHYSICAL EXAMINATION:    Constitutional: Well-nourished. No physical deformities. Normally developed. Good grooming.  Neck: Neck symmetrical, not swollen. Normal tracheal position.  Respiratory: No  labored breathing, no use of accessory muscles.   Cardiovascular: Normal temperature, normal extremity pulses, no swelling, no varicosities.  Neurologic / Psychiatric: Oriented to time, oriented to place, oriented to person. No depression, no anxiety, no agitation.  Gastrointestinal: Obese abdomen. No mass, no tenderness, no rigidity. No CVA, flank, or lower abdominal tenderness.     PAST DATA REVIEWED:  Source Of History:  Patient, Medical Record Summary  Lab Test Review:   PSA  Records Review:   Previous Patient Records  Urine Test Review:   Urinalysis  X-Ray Review: KUB: Reviewed Films. Discussed With Patient.     11/02/18 04/14/18 12/28/17 08/25/17 02/23/17 05/22/16 12/09/11 06/16/09  PSA  Total PSA <0.015 ng/mL <0.015 ng/mL <0.015 ng/mL <0.015 ng/mL <0.02 ng/mL 13.79 ng/dl 2.2 ng/dl 1.02 ng/dl    11/02/18 12/28/17 08/25/17 02/23/17 07/23/16  Hormones  Testosterone, Total 15.8 ng/dL 12.1 ng/dL <10 ng/dL <10 ng/dL 216.0 pg/dL    PROCEDURES:         KUB - 74018  A single view of the abdomen is obtained. Right proximal ureteral calculi measuring 8.5 mm approximately seen along the anatomical expected course of the right ureter between L2 and L3. No other ureteral calculi identified on today's exam. Stable degenerative changes along the lumbar spine noted. No other concerning bony abnormality noted. Brachii therapy seeds noted within the prostate. Bladder grossly appears free of obstruction otherwise. Normal-appearing bowel gas and stool pattern.      Patient confirmed No Neulasta OnPro Device.           Urinalysis w/Scope Dipstick Dipstick Cont'd Micro  Color: Yellow Bilirubin: Neg mg/dL WBC/hpf: 0 - 5/hpf  Appearance: Clear Ketones: Neg mg/dL RBC/hpf: 20 - 40/hpf  Specific Gravity: 1.025 Blood: 2+ ery/uL Bacteria: NS (Not Seen)  pH: 6.0 Protein: Trace mg/dL Cystals: NS (Not Seen)  Glucose: Neg mg/dL Urobilinogen: 0.2 mg/dL Casts: NS (Not Seen)    Nitrites: Neg Trichomonas:  Not Present    Leukocyte Esterase: Neg leu/uL Mucous: Not Present      Epithelial Cells: NS (Not Seen)      Yeast: NS (Not Seen)      Sperm: Not Present    ASSESSMENT:      ICD-10 Details  1 GU:   Renal calculus - N20.0   2   History of prostate cancer - Z85.46    PLAN:            Medications New Meds: Tamsulosin Hcl 0.4 mg capsule 1 capsule PO  Daily   #30  2 Refill(s)  Hydrocodone-Acetaminophen 5 mg-325 mg tablet 1-2 tablet PO Q 6 H PRN   #20  0 Refill(s)            Orders Labs Urine Culture  X-Rays: KUB          Schedule Return Visit/Planned Activity: Next Available Appointment - Schedule Surgery          Document Letter(s):  Created for Patient: Clinical Summary         Notes:   Previously identified calculus in the right lower pole now migrated into the proximal ureter, well seen on today's KUB study. Given stone size, he likely will not pass the calculus on his own. He has had lithotripsy in the past and done well with stone material passage post treatment. Recommend we proceed with getting him set up with that once again in the immediate future. Risks, benefits, and alternatives were all discussed in detail with understanding expressed by the patient. I'll initiate alpha blockade with him as well as prescribe pain medication to have on hand for severe exacerbations of renal colic. SE of both new medications reviewed. He can continue Tylenol or ibuprofen in the interval stopping ibuprofen 3 days prior to the planned procedure. Emergency department follow-up instructions from the weekend discussed for painful inability to void, uncontrollable pain/discomfort, fevers and chills, uncontrollable nausea/vomiting. I'll send a message to Dr Jeffie Pollock to confirm plan of care. Urine c/s sent today as baseline testing prior to planned procedure.        Next Appointment:      Next Appointment: 03/28/2019 01:15 PM    Appointment Type: Office Visit Established Patient    Location: [facility] [phone]    Provider: Jiles Crocker    Reason for Visit: ks      * Signed by Jiles Crocker on 03/25/19 at 2:43 PM (EDT)*

## 2019-03-31 ENCOUNTER — Ambulatory Visit (HOSPITAL_COMMUNITY): Payer: Medicare Other

## 2019-03-31 ENCOUNTER — Encounter (HOSPITAL_COMMUNITY): Payer: Self-pay | Admitting: General Practice

## 2019-03-31 ENCOUNTER — Ambulatory Visit (HOSPITAL_COMMUNITY)
Admission: RE | Admit: 2019-03-31 | Discharge: 2019-03-31 | Disposition: A | Discharge status: Home / Self Care | Payer: Medicare Other | Source: Ambulatory Visit | Attending: Urology | Admitting: Urology

## 2019-03-31 ENCOUNTER — Encounter (HOSPITAL_COMMUNITY)
Admission: RE | Disposition: A | Discharge status: Home / Self Care | Payer: Self-pay | Source: Ambulatory Visit | Attending: Urology

## 2019-03-31 DIAGNOSIS — Z96653 Presence of artificial knee joint, bilateral: Secondary | ICD-10-CM | POA: Diagnosis not present

## 2019-03-31 DIAGNOSIS — N201 Calculus of ureter: Secondary | ICD-10-CM | POA: Diagnosis not present

## 2019-03-31 DIAGNOSIS — I10 Essential (primary) hypertension: Secondary | ICD-10-CM | POA: Insufficient documentation

## 2019-03-31 DIAGNOSIS — Z01818 Encounter for other preprocedural examination: Secondary | ICD-10-CM | POA: Diagnosis not present

## 2019-03-31 DIAGNOSIS — Z8546 Personal history of malignant neoplasm of prostate: Secondary | ICD-10-CM | POA: Insufficient documentation

## 2019-03-31 DIAGNOSIS — Z841 Family history of disorders of kidney and ureter: Secondary | ICD-10-CM | POA: Insufficient documentation

## 2019-03-31 DIAGNOSIS — Z923 Personal history of irradiation: Secondary | ICD-10-CM | POA: Insufficient documentation

## 2019-03-31 DIAGNOSIS — N200 Calculus of kidney: Secondary | ICD-10-CM | POA: Diagnosis not present

## 2019-03-31 HISTORY — PX: EXTRACORPOREAL SHOCK WAVE LITHOTRIPSY: SHX1557

## 2019-03-31 SURGERY — LITHOTRIPSY, ESWL
Anesthesia: LOCAL | Laterality: Right

## 2019-03-31 MED ORDER — HYDROCODONE-ACETAMINOPHEN 5-325 MG PO TABS: 1.0000 | ORAL_TABLET | Freq: Four times a day (QID) | ORAL | 0 refills | Status: DC | PRN

## 2019-03-31 MED ORDER — DIPHENHYDRAMINE HCL 25 MG PO CAPS
25.0000 mg | ORAL_CAPSULE | ORAL | Status: AC
  Administered 2019-03-31: 25 mg via ORAL
  Filled 2019-03-31: qty 1

## 2019-03-31 MED ORDER — SODIUM CHLORIDE 0.9 % IV SOLN
INTRAVENOUS | Status: DC
  Administered 2019-03-31: 07:00:00 via INTRAVENOUS

## 2019-03-31 MED ORDER — CIPROFLOXACIN HCL 500 MG PO TABS
500.0000 mg | ORAL_TABLET | ORAL | Status: AC
  Administered 2019-03-31: 500 mg via ORAL
  Filled 2019-03-31: qty 1

## 2019-03-31 MED ORDER — DIAZEPAM 5 MG PO TABS
10.0000 mg | ORAL_TABLET | ORAL | Status: AC
  Administered 2019-03-31: 10 mg via ORAL
  Filled 2019-03-31: qty 2

## 2019-03-31 NOTE — Interval H&P Note (Signed)
History and Physical Interval Note:  03/31/2019 7:37 AM  Jeffrey Goodman  has presented today for surgery, with the diagnosis of RIGHT URETERAL STONE.  The various methods of treatment have been discussed with the patient and family. After consideration of risks, benefits and other options for treatment, the patient has consented to  Procedure(s): EXTRACORPOREAL SHOCK WAVE LITHOTRIPSY (ESWL) (Right) as a surgical intervention.  The patient's history has been reviewed, patient examined, no change in status, stable for surgery.  I have reviewed the patient's chart and labs.  Questions were answered to the patient's satisfaction.     [employer]

## 2019-03-31 NOTE — Discharge Instructions (Addendum)
1. You should strain your urine and collect all fragments and bring them to your follow up appointment.  2. You should take your pain medication as needed.  Please call if your pain is severe to the point that it is not controlled with your pain medication. 3. You should call if you develop fever > 101 or persistent nausea or vomiting. 4. Your doctor may prescribe tamsulosin to take to help facilitate stone passage.    Lithotripsy, Care After This sheet gives you information about how to care for yourself after your procedure. Your health care provider may also give you more specific instructions. If you have problems or questions, contact your health care provider. What can I expect after the procedure? After the procedure, it is common to have:  Some blood in your urine. This should only last for a few days.  Soreness in your back, sides, or upper abdomen for a few days.  Blotches or bruises on your back where the pressure wave entered the skin.  Pain, discomfort, or nausea when pieces (fragments) of the kidney stone move through the tube that carries urine from the kidney to the bladder (ureter). Stone fragments may pass soon after the procedure, but they may continue to pass for up to 4-8 weeks. ? If you have severe pain or nausea, contact your health care provider. This may be caused by a large stone that was not broken up, and this may mean that you need more treatment.  Some pain or discomfort during urination.  Some pain or discomfort in the lower abdomen or (in men) at the base of the penis. Follow these instructions at home: Medicines  Take over-the-counter and prescription medicines only as told by your health care provider.  If you were prescribed an antibiotic medicine, take it as told by your health care provider. Do not stop taking the antibiotic even if you start to feel better.  Do not drive for 24 hours if you were given a medicine to help you relax (sedative).  Do  not drive or use heavy machinery while taking prescription pain medicine. Eating and drinking      Drink enough water and fluids to keep your urine clear or pale yellow. This helps any remaining pieces of the stone to pass. It can also help prevent new stones from forming.  Eat plenty of fresh fruits and vegetables.  Follow instructions from your health care provider about eating and drinking restrictions. You may be instructed: ? To reduce how much salt (sodium) you eat or drink. Check ingredients and nutrition facts on packaged foods and beverages. ? To reduce how much meat you eat.  Eat the recommended amount of calcium for your age and gender. Ask your health care provider how much calcium you should have. General instructions  Get plenty of rest.  Most people can resume normal activities 1-2 days after the procedure. Ask your health care provider what activities are safe for you.  Your health care provider may direct you to lie in a certain position (postural drainage) and tap firmly (percuss) over your kidney area to help stone fragments pass. Follow instructions as told by your health care provider.  If directed, strain all urine through the strainer that was provided by your health care provider. ? Keep all fragments for your health care provider to see. Any stones that are found may be sent to a medical lab for examination. The stone may be as small as a grain of salt.  Keep all follow-up visits as told by your health care provider. This is important. Contact a health care provider if:  You have pain that is severe or does not get better with medicine.  You have nausea that is severe or does not go away.  You have blood in your urine longer than your health care provider told you to expect.  You have more blood in your urine.  You have pain during urination that does not go away.  You urinate more frequently than usual and this does not go away.  You develop a rash  or any other possible signs of an allergic reaction. Get help right away if:  You have severe pain in your back, sides, or upper abdomen.  You have severe pain while urinating.  Your urine is very dark red.  You have blood in your stool (feces).  You cannot pass any urine at all.  You feel a strong urge to urinate after emptying your bladder.  You have a fever or chills.  You develop shortness of breath, difficulty breathing, or chest pain.  You have severe nausea that leads to persistent vomiting.  You faint. Summary  After this procedure, it is common to have some pain, discomfort, or nausea when pieces (fragments) of the kidney stone move through the tube that carries urine from the kidney to the bladder (ureter). If this pain or nausea is severe, however, you should contact your health care provider.  Most people can resume normal activities 1-2 days after the procedure. Ask your health care provider what activities are safe for you.  Drink enough water and fluids to keep your urine clear or pale yellow. This helps any remaining pieces of the stone to pass, and it can help prevent new stones from forming.  If directed, strain your urine and keep all fragments for your health care provider to see. Fragments or stones may be as small as a grain of salt.  Get help right away if you have severe pain in your back, sides, or upper abdomen or have severe pain while urinating. This information is not intended to replace advice given to you by your health care provider. Make sure you discuss any questions you have with your health care provider. Document Released: 09/14/2007 Document Revised: 12/06/2018 Document Reviewed: 07/16/2016 Elsevier Patient Education  2020 [employer].    Moderate Conscious Sedation, Adult, Care After These instructions provide you with information about caring for yourself after your procedure. Your health care provider may also give you more specific  instructions. Your treatment has been planned according to current medical practices, but problems sometimes occur. Call your health care provider if you have any problems or questions after your procedure. What can I expect after the procedure? After your procedure, it is common:  To feel sleepy for several hours.  To feel clumsy and have poor balance for several hours.  To have poor judgment for several hours.  To vomit if you eat too soon. Follow these instructions at home: For at least 24 hours after the procedure:   Do not: ? Participate in activities where you could fall or become injured. ? Drive. ? Use heavy machinery. ? Drink alcohol. ? Take sleeping pills or medicines that cause drowsiness. ? Make important decisions or sign legal documents. ? Take care of children on your own.  Rest. Eating and drinking  Follow the diet recommended by your health care provider.  If you vomit: ? Drink water, juice, or  soup when you can drink without vomiting. ? Make sure you have little or no nausea before eating solid foods. General instructions  Have a responsible adult stay with you until you are awake and alert.  Take over-the-counter and prescription medicines only as told by your health care provider.  If you smoke, do not smoke without supervision.  Keep all follow-up visits as told by your health care provider. This is important. Contact a health care provider if:  You keep feeling nauseous or you keep vomiting.  You feel light-headed.  You develop a rash.  You have a fever. Get help right away if:  You have trouble breathing. This information is not intended to replace advice given to you by your health care provider. Make sure you discuss any questions you have with your health care provider. Document Released: 06/15/2013 Document Revised: 08/07/2017 Document Reviewed: 12/15/2015 Elsevier Patient Education  2020 [employer].

## 2019-03-31 NOTE — Op Note (Signed)
See [facility] operative note scanned into chart. Also because of the size, density, location and other factors that cannot be anticipated I feel this will likely be a staged procedure. This fact supersedes any indication in the scanned [facility] operative note to the contrary.  

## 2019-04-01 ENCOUNTER — Encounter (HOSPITAL_COMMUNITY): Payer: Self-pay | Admitting: Urology

## 2019-04-14 DIAGNOSIS — N201 Calculus of ureter: Secondary | ICD-10-CM | POA: Diagnosis not present

## 2019-05-20 DIAGNOSIS — Z8546 Personal history of malignant neoplasm of prostate: Secondary | ICD-10-CM | POA: Diagnosis not present

## 2019-05-27 DIAGNOSIS — N201 Calculus of ureter: Secondary | ICD-10-CM | POA: Diagnosis not present

## 2019-05-30 DIAGNOSIS — N201 Calculus of ureter: Secondary | ICD-10-CM | POA: Diagnosis not present

## 2019-05-31 ENCOUNTER — Other Ambulatory Visit: Payer: Self-pay | Admitting: Urology

## 2019-05-31 ENCOUNTER — Encounter (HOSPITAL_BASED_OUTPATIENT_CLINIC_OR_DEPARTMENT_OTHER): Payer: Self-pay | Admitting: *Deleted

## 2019-05-31 ENCOUNTER — Other Ambulatory Visit (HOSPITAL_COMMUNITY)
Admission: RE | Admit: 2019-05-31 | Discharge: 2019-05-31 | Disposition: A | Discharge status: Home / Self Care | Payer: Medicare Other | Source: Ambulatory Visit | Attending: Urology | Admitting: Urology

## 2019-05-31 ENCOUNTER — Other Ambulatory Visit: Payer: Self-pay

## 2019-05-31 DIAGNOSIS — Z20828 Contact with and (suspected) exposure to other viral communicable diseases: Secondary | ICD-10-CM | POA: Diagnosis not present

## 2019-05-31 DIAGNOSIS — Z01812 Encounter for preprocedural laboratory examination: Secondary | ICD-10-CM | POA: Diagnosis not present

## 2019-05-31 LAB — SARS CORONAVIRUS 2 (TAT 6-24 HRS): SARS Coronavirus 2: NEGATIVE

## 2019-05-31 NOTE — Progress Notes (Signed)
Spoke w/ pt via phone for pre-op interview.  Npo after mn.  Arrive at 0830.  Needs istat 8 and ekg.  Pt getting covid test done today.  Will take flomax am dos w/ sips of water.

## 2019-06-01 NOTE — H&P (Signed)
CC: I have prostate cancer.  HPI: Jeffrey Goodman is a 68 year-old male established patient who is here evaluation for treatment of prostate cancer.  He has undergone External Beam Radiation Therapy for treatment. He has undergone Hormonal Therapy for treatment.   He does not have urinary incontinence. He has not recently had unwanted weight loss. He is not having pain in new locations.   Jeffrey Goodman returns today in f/u for his T2a N0 M0 Gleason 9 prostate cancer that was found on a biopsy on 06/23/16 for a right nodule and PSA of 13.79. He elected ADT and EXRT for therapy and has received firmagon 240mg . He was last given Lupron 45mg  IM on 04/21/18 for his final dose in the 2 year sequence. He also had gold seeds placed then and 12/02/16 was his last day for XRT. He underwent brachytherapy seed placement on 12/23/16. His PSA remains <0.015 despite a rise in the testosterone to 113 from 15.8. He is voiding with improvement in the frequency and nocturia. He is emptyinig better on the tamsulosin with reduced dysuria and intermittency. He has a good stream. He has had no hematuria. He has a stable weight and no bone pain. He has no other associated signs or symptoms.        CC: I have a history of kidney stones.  HPI: He has had ESWL for treatment of his stones in the past.   05/27/19: Jeffrey Goodman returns today in f/u. He had right ESWL for a proximal stone and has passed multiple fragments. he KUB showed no residual stones in the ureter in early August but he had small bilateral renal stones. He has had no hematuria or flank pain.      AUA Symptom Score: Less than 50% of the time he has the sensation of not emptying his bladder completely when finished urinating. Less than 20% of the time he has to urinate again fewer than two hours after he has finished urinating. Less than 20% of the time he has to start and stop again several times when he urinates. Less than 20% of the time he finds it difficult  to postpone urination. Less than 20% of the time he has a weak urinary stream. Less than 20% of the time he has to push or strain to begin urination. He has to get up to urinate 3 times from the time he goes to bed until the time he gets up in the morning.   Calculated AUA Symptom Score: 10    ALLERGIES: No Allergies    MEDICATIONS: Tamsulosin Hcl 0.4 mg capsule 1 capsule PO Daily  Amlodipine Besylate 10 mg tablet  Benadryl Allergy 25 mg tablet Oral PRN  Hydrocodone-Acetaminophen 5 mg-325 mg tablet 1-2 tablet PO Q 6 H PRN  Irbesartan-Hydrochlorothiazide 150 mg-12.5 mg tablet     GU PSH: ESWL, Right - 03/31/2019, 2013, 2012, 2008 Locm 300-399Mg /Ml Iodine,1Ml - 2017 PLACE RT DEVICE/MARKER, PROS - 2018 Prostate Needle Biopsy - 2017 Jeffrey Goodman, PROS - 2018       PSH Notes: Knee Replacement, Lithotripsy, Lithotripsy, Lithotripsy   NON-GU PSH: Knee replacement, Bilateral Revise Knee Joint - 2014 Surgical Pathology, Gross And Microscopic Examination For Prostate Needle - 2017     GU PMH: History of prostate cancer - 03/25/2019, His PSA remains undetectable but his testosterone remains castrate. f/u in 6 months with labs. , - 11/08/2018 (Stable), He is doing well on current therapy and will get his last Lupron today. he will return  in 55months with a PSA and testosterone level. , - 04/21/2018, His PSA is down to undetectible with ADT and RTX. His testosterone is castrate. He has dysuria but doesn't feel he needs treatment at this time. , - 2018 Renal calculus - 03/25/2019, Bilateral kidney stones, - 2017, Kidney stone on right side, - 2015 History of urolithiasis, He has had no hematuria or flank pain. - 11/08/2018 Prostate Cancer (Stable), His PSA is suppressed on ADT. I will have him return in 3 months with a PSA for an exam and Lupron. - 01/04/2018, His PSA remains suppressed on Lupron. He will return in 6 weeks for Lupron and see me in 4 months with a PSA and testosterone. , -  09/07/2017, - 2018, - 2018, He has a T2a N0 M0 Gleason 9 prostate cancer. , - 2017 Dysuria - 2018 Elevated PSA - 2017, He has a marked elevation of the PSA but the last I had was from 2008. , - 2017 Nocturia, He has nocturia and intermittency. - 2017 Prostate nodule w/ LUTS, He has a right prostate nodule but no extracapsular gross disease. - 2017 Abdominal Pain Unspec, Rt flank pain - 2015 Ureteral calculus, Calculus of right ureter - 2015, Proximal Ureteral Stone On The Left, - 2014, Ureteral Stone, - 2014 Flank Pain, Diffuse Abdominal Pain - 2014      PMH Notes:  2007-07-29 14:49:23 - Note: Nephrolithiasis Of The Right Kidney  2011-07-14 10:41:06 - Note: Arthritis   NON-GU PMH: Low testosterone - 2018 Long term use of hormonal medication - 2018 Encounter for general adult medical examination without abnormal findings, Encounter for preventive health examination - 2015 Hypercalciuria, Hypercalciuria - 2014 Personal history of other diseases of the circulatory system, History of hypertension - 2014    FAMILY HISTORY: Diabetes - Father Family Health Status Number - Runs In Family Hypertension - Father, Mother nephrolithiasis - Runs In Family Stroke Syndrome - Mother   SOCIAL HISTORY: Marital Status: Single Preferred Language: English; Ethnicity: Not Hispanic Or Latino; Race: White     Notes: Occupation:, Caffeine Use, Marital History - Single, Never A Smoker, Tobacco Use, Alcohol Use   REVIEW OF SYSTEMS:    GU Review Male:   Patient denies frequent urination, hard to postpone urination, burning/ pain with urination, get up at night to urinate, leakage of urine, stream starts and stops, trouble starting your stream, have to strain to urinate , erection problems, and penile pain.  Gastrointestinal (Upper):   Patient denies nausea, vomiting, and indigestion/ heartburn.  Gastrointestinal (Lower):   Patient denies diarrhea and constipation.  Constitutional:   Patient denies fever,  night sweats, weight loss, and fatigue.  Skin:   Patient denies skin rash/ lesion and itching.  Eyes:   Patient denies blurred vision and double vision.  Ears/ Nose/ Throat:   Patient denies sore throat and sinus problems.  Hematologic/Lymphatic:   Patient denies swollen glands and easy bruising.  Cardiovascular:   Patient denies leg swelling and chest pains.  Respiratory:   Patient denies cough and shortness of breath.  Endocrine:   Patient denies excessive thirst.  Musculoskeletal:   Patient denies back pain and joint pain.  Neurological:   Patient denies headaches and dizziness.  Psychologic:   Patient denies depression and anxiety.   VITAL SIGNS:      05/27/2019 02:57 PM  BP 128/88 mmHg  Pulse 98 /min  Temperature 97.8 F / 36.5 C   PAST DATA REVIEWED:  Source Of History:  Patient  05/24/19 11/02/18 04/14/18 12/28/17 08/25/17 02/23/17 05/22/16 12/09/11  PSA  Total PSA <0.015 ng/mL <0.015 ng/mL <0.015 ng/mL <0.015 ng/mL <0.015 ng/mL <0.02 ng/mL 13.79 ng/dl 2.2 ng/dl    05/24/19 11/02/18 12/28/17 08/25/17 02/23/17 07/23/16  Hormones  Testosterone, Total 113.6 ng/dL 15.8 ng/dL 12.1 ng/dL <10 ng/dL <10 ng/dL 216.0 pg/dL    PROCEDURES:         Renal Ultrasound (Limited) - [id number]  RT Kidney: Length: 11.8 cm Depth:7.4 cm Cortical Width: 1.3 cm Width:6.2 cm    Right Kidney/Ureter:  Hydro noted, Multiple stones seen. Dilated ureter and ?stone seen in ureter= 0.83cm.   Bladder:  Not seen.      Patient confirmed No Neulasta OnPro Device.            KUB - [id number]  A single view of the abdomen is obtained.      Patient confirmed No Neulasta OnPro Device.            Urinalysis w/Scope Dipstick Dipstick Cont'd Micro  Color: Yellow Bilirubin: Neg mg/dL WBC/hpf: 0 - 5/hpf  Appearance: Clear Ketones: Neg mg/dL RBC/hpf: 0 - 2/hpf  Specific Gravity: 1.020 Blood: Neg ery/uL Bacteria: Rare (0-9/hpf)  pH: 5.5 Protein: Neg mg/dL Cystals: NS (Not Seen)  Glucose: Neg mg/dL  Urobilinogen: 0.2 mg/dL Casts: NS (Not Seen)    Nitrites: Neg Trichomonas: Not Present    Leukocyte Esterase: Trace leu/uL Mucous: Not Present      Epithelial Cells: NS (Not Seen)      Yeast: NS (Not Seen)      Sperm: Not Present    ASSESSMENT:      ICD-10 Details  1 GU:   History of prostate cancer - Z85.46 His PSA remains undetectible with a rising testosterone. I will have him return in 6 months with a PSA and testosterone.   2   Prostate nodule w/ LUTS - N40.3   3   Nocturia - R35.1   4   Ureteral calculus - N20.1 His stone is not significantly changed but has moved down to interspaces. I will check an [place] today and if he is not obstructed he will return in a month but if he is obstructed we will need to consider repeat ESWL or URS. The US showed mod/severe hydronephrosis with renal and ureteral stones. I will contact him to discuss options.      PLAN:           Orders X-Rays: Renal Ultrasound (Limited) - right    KUB          Schedule Labs: 1 Month - Urinalysis    6 Months - PSA    6 Months - Total Testosterone    6 Months - Urinalysis  X-Rays: 1 Month - KUB  Return Visit/Planned Activity: 1 Month - Office Visit, Extender  Return Visit/Planned Activity: 6 Months - Office Visit          Document Letter(s):  Created for Patient: Clinical Summary   Renal US showed marked hydro and the stone in the proximal ureter.    He has elected to proceed with ureteroscopy and the risks were reviewed in detail.

## 2019-06-02 ENCOUNTER — Encounter (HOSPITAL_BASED_OUTPATIENT_CLINIC_OR_DEPARTMENT_OTHER)
Admission: RE | Disposition: A | Discharge status: Home / Self Care | Payer: Self-pay | Source: Home / Self Care | Attending: Urology

## 2019-06-02 ENCOUNTER — Ambulatory Visit (HOSPITAL_BASED_OUTPATIENT_CLINIC_OR_DEPARTMENT_OTHER)
Admission: RE | Admit: 2019-06-02 | Discharge: 2019-06-02 | Disposition: A | Discharge status: Home / Self Care | Payer: Medicare Other | Attending: Urology | Admitting: Urology

## 2019-06-02 ENCOUNTER — Ambulatory Visit (HOSPITAL_BASED_OUTPATIENT_CLINIC_OR_DEPARTMENT_OTHER): Payer: Medicare Other | Admitting: Anesthesiology

## 2019-06-02 ENCOUNTER — Encounter (HOSPITAL_BASED_OUTPATIENT_CLINIC_OR_DEPARTMENT_OTHER): Payer: Self-pay

## 2019-06-02 DIAGNOSIS — Z79899 Other long term (current) drug therapy: Secondary | ICD-10-CM | POA: Diagnosis not present

## 2019-06-02 DIAGNOSIS — M1990 Unspecified osteoarthritis, unspecified site: Secondary | ICD-10-CM | POA: Insufficient documentation

## 2019-06-02 DIAGNOSIS — I10 Essential (primary) hypertension: Secondary | ICD-10-CM | POA: Diagnosis not present

## 2019-06-02 DIAGNOSIS — M1710 Unilateral primary osteoarthritis, unspecified knee: Secondary | ICD-10-CM | POA: Diagnosis not present

## 2019-06-02 DIAGNOSIS — Z87891 Personal history of nicotine dependence: Secondary | ICD-10-CM | POA: Diagnosis not present

## 2019-06-02 DIAGNOSIS — Z96659 Presence of unspecified artificial knee joint: Secondary | ICD-10-CM | POA: Diagnosis not present

## 2019-06-02 DIAGNOSIS — Z96653 Presence of artificial knee joint, bilateral: Secondary | ICD-10-CM | POA: Insufficient documentation

## 2019-06-02 DIAGNOSIS — N132 Hydronephrosis with renal and ureteral calculous obstruction: Secondary | ICD-10-CM | POA: Diagnosis not present

## 2019-06-02 DIAGNOSIS — Z8249 Family history of ischemic heart disease and other diseases of the circulatory system: Secondary | ICD-10-CM | POA: Diagnosis not present

## 2019-06-02 DIAGNOSIS — C61 Malignant neoplasm of prostate: Secondary | ICD-10-CM | POA: Diagnosis not present

## 2019-06-02 DIAGNOSIS — N202 Calculus of kidney with calculus of ureter: Secondary | ICD-10-CM | POA: Diagnosis not present

## 2019-06-02 DIAGNOSIS — Z87442 Personal history of urinary calculi: Secondary | ICD-10-CM | POA: Diagnosis not present

## 2019-06-02 DIAGNOSIS — K219 Gastro-esophageal reflux disease without esophagitis: Secondary | ICD-10-CM | POA: Insufficient documentation

## 2019-06-02 DIAGNOSIS — N403 Nodular prostate with lower urinary tract symptoms: Secondary | ICD-10-CM | POA: Insufficient documentation

## 2019-06-02 DIAGNOSIS — R3915 Urgency of urination: Secondary | ICD-10-CM | POA: Insufficient documentation

## 2019-06-02 HISTORY — DX: Calculus of kidney: N20.0

## 2019-06-02 HISTORY — PX: CYSTOSCOPY WITH RETROGRADE PYELOGRAM, URETEROSCOPY AND STENT PLACEMENT: SHX5789

## 2019-06-02 HISTORY — DX: Calculus of ureter: N20.1

## 2019-06-02 HISTORY — PX: HOLMIUM LASER APPLICATION: SHX5852

## 2019-06-02 LAB — POCT I-STAT, CHEM 8
BUN: 36 mg/dL — ABNORMAL HIGH (ref 8–23)
Calcium, Ion: 1.27 mmol/L (ref 1.15–1.40)
Chloride: 107 mmol/L (ref 98–111)
Creatinine, Ser: 1.4 mg/dL — ABNORMAL HIGH (ref 0.61–1.24)
Glucose, Bld: 119 mg/dL — ABNORMAL HIGH (ref 70–99)
HCT: 43 % (ref 39.0–52.0)
Hemoglobin: 14.6 g/dL (ref 13.0–17.0)
Potassium: 4.2 mmol/L (ref 3.5–5.1)
Sodium: 140 mmol/L (ref 135–145)
TCO2: 22 mmol/L (ref 22–32)

## 2019-06-02 SURGERY — CYSTOURETEROSCOPY, WITH RETROGRADE PYELOGRAM AND STENT INSERTION
Anesthesia: General | Site: Renal | Laterality: Right

## 2019-06-02 MED ORDER — ONDANSETRON HCL 4 MG/2ML IJ SOLN
INTRAMUSCULAR | Status: AC
  Filled 2019-06-02: qty 2

## 2019-06-02 MED ORDER — DEXAMETHASONE SODIUM PHOSPHATE 4 MG/ML IJ SOLN
INTRAMUSCULAR | Status: DC | PRN
  Administered 2019-06-02: 10 mg via INTRAVENOUS

## 2019-06-02 MED ORDER — MIDAZOLAM HCL 5 MG/5ML IJ SOLN
INTRAMUSCULAR | Status: DC | PRN
  Administered 2019-06-02: 2 mg via INTRAVENOUS

## 2019-06-02 MED ORDER — ONDANSETRON HCL 4 MG/2ML IJ SOLN
INTRAMUSCULAR | Status: DC | PRN
  Administered 2019-06-02: 4 mg via INTRAVENOUS

## 2019-06-02 MED ORDER — OXYCODONE HCL 5 MG PO TABS
ORAL_TABLET | ORAL | Status: AC
  Filled 2019-06-02: qty 1

## 2019-06-02 MED ORDER — MORPHINE SULFATE (PF) 2 MG/ML IV SOLN
2.0000 mg | INTRAVENOUS | Status: DC | PRN
  Filled 2019-06-02: qty 1

## 2019-06-02 MED ORDER — CEFAZOLIN SODIUM-DEXTROSE 2-4 GM/100ML-% IV SOLN
INTRAVENOUS | Status: AC
  Filled 2019-06-02: qty 100

## 2019-06-02 MED ORDER — ACETAMINOPHEN 500 MG PO TABS
1000.0000 mg | ORAL_TABLET | Freq: Once | ORAL | Status: AC
  Administered 2019-06-02: 1000 mg via ORAL
  Filled 2019-06-02: qty 2

## 2019-06-02 MED ORDER — SODIUM CHLORIDE 0.9 % IV SOLN
250.0000 mL | INTRAVENOUS | Status: DC | PRN
  Filled 2019-06-02: qty 250

## 2019-06-02 MED ORDER — LIDOCAINE 2% (20 MG/ML) 5 ML SYRINGE
INTRAMUSCULAR | Status: DC | PRN
  Administered 2019-06-02: 100 mg via INTRAVENOUS

## 2019-06-02 MED ORDER — ACETAMINOPHEN 500 MG PO TABS
ORAL_TABLET | ORAL | Status: AC
  Filled 2019-06-02: qty 2

## 2019-06-02 MED ORDER — PHENAZOPYRIDINE HCL 200 MG PO TABS: 200.0000 mg | ORAL_TABLET | Freq: Three times a day (TID) | ORAL | 0 refills | Status: AC | PRN

## 2019-06-02 MED ORDER — ARTIFICIAL TEARS OPHTHALMIC OINT
TOPICAL_OINTMENT | OPHTHALMIC | Status: AC
  Filled 2019-06-02: qty 3.5

## 2019-06-02 MED ORDER — DEXAMETHASONE SODIUM PHOSPHATE 10 MG/ML IJ SOLN
INTRAMUSCULAR | Status: AC
  Filled 2019-06-02: qty 1

## 2019-06-02 MED ORDER — FENTANYL CITRATE (PF) 100 MCG/2ML IJ SOLN
INTRAMUSCULAR | Status: AC
  Filled 2019-06-02: qty 2

## 2019-06-02 MED ORDER — SODIUM CHLORIDE 0.9% FLUSH
3.0000 mL | Freq: Two times a day (BID) | INTRAVENOUS | Status: DC
  Filled 2019-06-02: qty 3

## 2019-06-02 MED ORDER — OXYCODONE HCL 5 MG PO TABS
5.0000 mg | ORAL_TABLET | ORAL | Status: DC | PRN
  Administered 2019-06-02: 5 mg via ORAL
  Filled 2019-06-02: qty 2

## 2019-06-02 MED ORDER — SODIUM CHLORIDE 0.9 % IR SOLN
Status: DC | PRN
  Administered 2019-06-02: 3000 mL

## 2019-06-02 MED ORDER — MIDAZOLAM HCL 2 MG/2ML IJ SOLN
INTRAMUSCULAR | Status: AC
  Filled 2019-06-02: qty 2

## 2019-06-02 MED ORDER — PROPOFOL 10 MG/ML IV BOLUS
INTRAVENOUS | Status: DC | PRN
  Administered 2019-06-02: 50 mg via INTRAVENOUS
  Administered 2019-06-02: 150 mg via INTRAVENOUS

## 2019-06-02 MED ORDER — HYDROCODONE-ACETAMINOPHEN 5-325 MG PO TABS: 1.0000 | ORAL_TABLET | Freq: Four times a day (QID) | ORAL | 0 refills | Status: AC | PRN

## 2019-06-02 MED ORDER — IOHEXOL 300 MG/ML  SOLN
INTRAMUSCULAR | Status: DC | PRN
  Administered 2019-06-02: 10 mL

## 2019-06-02 MED ORDER — SODIUM CHLORIDE 0.9% FLUSH
3.0000 mL | INTRAVENOUS | Status: DC | PRN
  Filled 2019-06-02: qty 3

## 2019-06-02 MED ORDER — FENTANYL CITRATE (PF) 100 MCG/2ML IJ SOLN
25.0000 ug | INTRAMUSCULAR | Status: DC | PRN
  Filled 2019-06-02: qty 1

## 2019-06-02 MED ORDER — PROPOFOL 10 MG/ML IV BOLUS
INTRAVENOUS | Status: AC
  Filled 2019-06-02: qty 20

## 2019-06-02 MED ORDER — CEFAZOLIN SODIUM-DEXTROSE 2-4 GM/100ML-% IV SOLN
2.0000 g | INTRAVENOUS | Status: AC
  Administered 2019-06-02: 2 g via INTRAVENOUS
  Filled 2019-06-02: qty 100

## 2019-06-02 MED ORDER — FENTANYL CITRATE (PF) 100 MCG/2ML IJ SOLN
INTRAMUSCULAR | Status: DC | PRN
  Administered 2019-06-02 (×4): 25 ug via INTRAVENOUS
  Administered 2019-06-02: 50 ug via INTRAVENOUS
  Administered 2019-06-02: 25 ug via INTRAVENOUS
  Administered 2019-06-02: 50 ug via INTRAVENOUS
  Administered 2019-06-02 (×3): 25 ug via INTRAVENOUS

## 2019-06-02 MED ORDER — LIDOCAINE 2% (20 MG/ML) 5 ML SYRINGE
INTRAMUSCULAR | Status: AC
  Filled 2019-06-02: qty 5

## 2019-06-02 MED ORDER — LACTATED RINGERS IV SOLN
INTRAVENOUS | Status: DC
  Administered 2019-06-02: 09:00:00 via INTRAVENOUS
  Filled 2019-06-02: qty 1000

## 2019-06-02 MED ORDER — ACETAMINOPHEN 650 MG RE SUPP
650.0000 mg | RECTAL | Status: DC | PRN
  Filled 2019-06-02: qty 1

## 2019-06-02 MED ORDER — ACETAMINOPHEN 325 MG PO TABS
650.0000 mg | ORAL_TABLET | ORAL | Status: DC | PRN
  Filled 2019-06-02: qty 2

## 2019-06-02 SURGICAL SUPPLY — 31 items
BAG DRAIN URO-CYSTO SKYTR STRL (DRAIN) ×2 IMPLANT
BAG DRN UROCATH (DRAIN) ×1
BASKET STONE 1.7 NGAGE (UROLOGICAL SUPPLIES) ×1 IMPLANT
BASKET ZERO TIP NITINOL 2.4FR (BASKET) IMPLANT
BSKT STON RTRVL ZERO TP 2.4FR (BASKET)
CATH INTERMIT  6FR 70CM (CATHETERS) ×1 IMPLANT
CATH URET 5FR 28IN CONE TIP (BALLOONS)
CATH URET 5FR 28IN OPEN ENDED (CATHETERS) IMPLANT
CATH URET 5FR 70CM CONE TIP (BALLOONS) IMPLANT
CLOTH BEACON ORANGE TIMEOUT ST (SAFETY) ×2 IMPLANT
ELECT REM PT RETURN 9FT ADLT (ELECTROSURGICAL)
ELECTRODE REM PT RTRN 9FT ADLT (ELECTROSURGICAL) IMPLANT
FIBER LASER FLEXIVA 365 (UROLOGICAL SUPPLIES) IMPLANT
FIBER LASER TRAC TIP (UROLOGICAL SUPPLIES) ×1 IMPLANT
GLOVE BIOGEL PI IND STRL 6.5 (GLOVE) IMPLANT
GLOVE BIOGEL PI INDICATOR 6.5 (GLOVE) ×3
GLOVE SURG SS PI 8.0 STRL IVOR (GLOVE) ×2 IMPLANT
GOWN STRL REUS W/ TWL LRG LVL3 (GOWN DISPOSABLE) IMPLANT
GOWN STRL REUS W/TWL LRG LVL3 (GOWN DISPOSABLE) ×4
GOWN STRL REUS W/TWL XL LVL3 (GOWN DISPOSABLE) ×2 IMPLANT
GUIDEWIRE ANG ZIPWIRE 038X150 (WIRE) IMPLANT
GUIDEWIRE STR DUAL SENSOR (WIRE) ×2 IMPLANT
IV NS IRRIG 3000ML ARTHROMATIC (IV SOLUTION) ×2 IMPLANT
KIT TURNOVER CYSTO (KITS) ×2 IMPLANT
MANIFOLD NEPTUNE II (INSTRUMENTS) ×2 IMPLANT
NS IRRIG 500ML POUR BTL (IV SOLUTION) ×2 IMPLANT
PACK CYSTO (CUSTOM PROCEDURE TRAY) ×2 IMPLANT
SHEATH URET ACCESS 12FR/35CM (UROLOGICAL SUPPLIES) ×1 IMPLANT
STENT URET 6FRX26 CONTOUR (STENTS) ×1 IMPLANT
TUBE CONNECTING 12X1/4 (SUCTIONS) ×2 IMPLANT
TUBING UROLOGY SET (TUBING) ×1 IMPLANT

## 2019-06-02 NOTE — Interval H&P Note (Signed)
History and Physical Interval Note:  06/02/2019 10:16 AM  Jeffrey Goodman  has presented today for surgery, with the diagnosis of RIGHT URETERAL AND RENAL STONES.  The various methods of treatment have been discussed with the patient and family. After consideration of risks, benefits and other options for treatment, the patient has consented to  Procedure(s): CYSTOSCOPY WITH RETROGRADE PYELOGRAM, URETEROSCOPY AND STENT PLACEMENT (Right) HOLMIUM LASER APPLICATION (Right) as a surgical intervention.  The patient's history has been reviewed, patient examined, no change in status, stable for surgery.  I have reviewed the patient's chart and labs.  Questions were answered to the patient's satisfaction.     Irine Seal

## 2019-06-02 NOTE — Anesthesia Postprocedure Evaluation (Signed)
Anesthesia Post Note  Patient: DAVAUN NAJERA  Procedure(s) Performed: CYSTOSCOPY WITH RETROGRADE PYELOGRAM, URETEROSCOPY AND STENT PLACEMENT (Right Renal) HOLMIUM LASER APPLICATION (Right Renal)     Patient location during evaluation: PACU Anesthesia Type: General Level of consciousness: awake and alert Pain management: pain level controlled Vital Signs Assessment: post-procedure vital signs reviewed and stable Respiratory status: spontaneous breathing, nonlabored ventilation, respiratory function stable and patient connected to nasal cannula oxygen Cardiovascular status: blood pressure returned to baseline and stable Postop Assessment: no apparent nausea or vomiting Anesthetic complications: no    Last Vitals:  Vitals:   06/02/19 1205 06/02/19 1304  BP: (!) 141/82 (!) 141/81  Pulse: 63 (!) 54  Resp: 13 12  Temp:    SpO2: 96% 99%    Last Pain:  Vitals:   06/02/19 1304  TempSrc:   PainSc: 3                  Jahmeir Geisen L Kaedyn Polivka

## 2019-06-02 NOTE — Discharge Instructions (Addendum)
Ureteral Stent Implantation, Care After °This sheet gives you information about how to care for yourself after your procedure. Your health care provider may also give you more specific instructions. If you have problems or questions, contact your health care provider. °What can I expect after the procedure? °After the procedure, it is common to have: °· Nausea. °· Mild pain when you urinate. You may feel this pain in your lower back or lower abdomen. The pain should stop within a few minutes after you urinate. This may last for up to 1 week. °· A small amount of blood in your urine for several days. °Follow these instructions at home: °Medicines °· Take over-the-counter and prescription medicines only as told by your health care provider. °· If you were prescribed an antibiotic medicine, take it as told by your health care provider. Do not stop taking the antibiotic even if you start to feel better. °· Do not drive for 24 hours if you were given a sedative during your procedure. °· Ask your health care provider if the medicine prescribed to you requires you to avoid driving or using heavy machinery. °Activity °· Rest as told by your health care provider. °· Avoid sitting for a long time without moving. Get up to take short walks every 1-2 hours. This is important to improve blood flow and breathing. Ask for help if you feel weak or unsteady. °· Return to your normal activities as told by your health care provider. Ask your health care provider what activities are safe for you. °General instructions ° °· Watch for any blood in your urine. Call your health care provider if the amount of blood in your urine increases. °· If you have a catheter: °? Follow instructions from your health care provider about taking care of your catheter and collection bag. °? Do not take baths, swim, or use a hot tub until your health care provider approves. Ask your health care provider if you may take showers. You may only be allowed to  take sponge baths. °· Drink enough fluid to keep your urine pale yellow. °· Do not use any products that contain nicotine or tobacco, such as cigarettes, e-cigarettes, and chewing tobacco. These can delay healing after surgery. If you need help quitting, ask your health care provider. °· Keep all follow-up visits as told by your health care provider. This is important. °Contact a health care provider if: °· You have pain that gets worse or does not get better with medicine, especially pain when you urinate. °· You have difficulty urinating. °· You feel nauseous or you vomit repeatedly during a period of more than 2 days after the procedure. °Get help right away if: °· Your urine is dark red or has blood clots in it. °· You are leaking urine (have incontinence). °· The end of the stent comes out of your urethra. °· You cannot urinate. °· You have sudden, sharp, or severe pain in your abdomen or lower back. °· You have a fever. °· You have swelling or pain in your legs. °· You have difficulty breathing. °Summary °· After the procedure, it is common to have mild pain when you urinate that goes away within a few minutes after you urinate. This may last for up to 1 week. °· Watch for any blood in your urine. Call your health care provider if the amount of blood in your urine increases. °· Take over-the-counter and prescription medicines only as told by your health care provider. °· Drink   enough fluid to keep your urine pale yellow.  I have left a stent without a string.  If you are tolerating it well then you can keep your appointment for follow up on 10/16 and we will remove the stent then.  If you have pain or excessive irritation with the stent, please contact me next week and we can discuss earlier removal.  I will be out of the office on Monday.  This information is not intended to replace advice given to you by your health care provider. Make sure you discuss any questions you have with your health care  provider. Document Released: 04/27/2013 Document Revised: 06/01/2018 Document Reviewed: 06/02/2018 Elsevier Patient Education  [place] Instructions  Activity: Get plenty of rest for the remainder of the day. A responsible individual must stay with you for 24 hours following the procedure.  For the next 24 hours, DO NOT: -Drive a car -Paediatric nurse -Drink alcoholic beverages -Take any medication unless instructed by your physician -Make any legal decisions or sign important papers.  Meals: Start with liquid foods such as gelatin or soup. Progress to regular foods as tolerated. Avoid greasy, spicy, heavy foods. If nausea and/or vomiting occur, drink only clear liquids until the nausea and/or vomiting subsides. Call your physician if vomiting continues.  Special Instructions/Symptoms: Your throat may feel dry or sore from the anesthesia or the breathing tube placed in your throat during surgery. If this causes discomfort, gargle with warm salt water. The discomfort should disappear within 24 hours.  If you had a scopolamine patch placed behind your ear for the management of post- operative nausea and/or vomiting:  1. The medication in the patch is effective for 72 hours, after which it should be removed.  Wrap patch in a tissue and discard in the trash. Wash hands thoroughly with soap and water. 2. You may remove the patch earlier than 72 hours if you experience unpleasant side effects which may include dry mouth, dizziness or visual disturbances. 3. Avoid touching the patch. Wash your hands with soap and water after contact with the patch.

## 2019-06-02 NOTE — Anesthesia Preprocedure Evaluation (Addendum)
Anesthesia Evaluation  Patient identified by MRN, date of birth, ID band Patient awake    Reviewed: Allergy & Precautions, NPO status , Patient's Chart, lab work & pertinent test results  Airway Mallampati: II  TM Distance: >3 FB Neck ROM: Full    Dental no notable dental hx. (+) Teeth Intact, Dental Advisory Given   Pulmonary neg pulmonary ROS,    Pulmonary exam normal breath sounds clear to auscultation       Cardiovascular hypertension, Normal cardiovascular exam Rhythm:Regular Rate:Normal     Neuro/Psych negative neurological ROS  negative psych ROS   GI/Hepatic Neg liver ROS, GERD  Controlled and Medicated,  Endo/Other  negative endocrine ROS  Renal/GU negative Renal ROS   H/o prostate CA negative genitourinary   Musculoskeletal  (+) Arthritis ,   Abdominal   Peds  Hematology negative hematology ROS (+)   Anesthesia Other Findings Right renal stones  Reproductive/Obstetrics                            Anesthesia Physical Anesthesia Plan  ASA: II  Anesthesia Plan: General   Post-op Pain Management:    Induction: Intravenous  PONV Risk Score and Plan: 2 and Ondansetron, Dexamethasone and Midazolam  Airway Management Planned: LMA  Additional Equipment:   Intra-op Plan:   Post-operative Plan: Extubation in OR  Informed Consent: I have reviewed the patients History and Physical, chart, labs and discussed the procedure including the risks, benefits and alternatives for the proposed anesthesia with the patient or authorized representative who has indicated his/her understanding and acceptance.     Dental advisory given  Plan Discussed with: CRNA  Anesthesia Plan Comments:         Anesthesia Quick Evaluation

## 2019-06-02 NOTE — Anesthesia Procedure Notes (Addendum)
Procedure Name: LMA Insertion Date/Time: 06/02/2019 10:29 AM Performed by: Justice Rocher, CRNA Pre-anesthesia Checklist: Patient identified, Emergency Drugs available, Suction available and Patient being monitored Patient Re-evaluated:Patient Re-evaluated prior to induction Oxygen Delivery Method: Circle system utilized Preoxygenation: Pre-oxygenation with 100% oxygen Induction Type: IV induction Ventilation: Mask ventilation without difficulty LMA: LMA inserted LMA Size: 5.0 Number of attempts: 1 Airway Equipment and Method: Bite block Placement Confirmation: positive ETCO2 and breath sounds checked- equal and bilateral Tube secured with: Tape Dental Injury: Teeth and Oropharynx as per pre-operative assessment  Comments: Lips dry preop - Small lip tear upper lip after LMA placed - ointment applied

## 2019-06-02 NOTE — Op Note (Signed)
Procedure: 1.  Cystoscopy with right retrograde pyelogram and interpretation. 2.  Right ureteroscopic stone extraction with holmium laser application and insertion of right double-J stent.  Preop diagnosis: Right proximal ureteral stone with obstruction. 2.  Renal stones.  Postop diagnosis: Same.  Surgeon: Dr. Irine Seal.  Anesthesia: General.  Specimen: Stone fragments.  Drain: 6 [place] by 26 cm contour double-J stent without tether.  EBL: Minimal.  Complications: None.  Indications: The patient is a 68 year old male with a history of an 8 mm right proximal ureteral stone that was treated with lithotripsy in July subsequent films demonstrated minimal fragmentation and ultrasound revealed obstruction so it was felt that ureteroscopy was indicated.  Procedure: He was taken the operating room where general anesthetic was induced.  He was given Ancef.  He was placed in lithotomy position and fitted with PAS hose.  His perineum and genitalia were prepped with Betadine solution he was draped in usual sterile fashion.  Cystoscopy was performed using a 23 [place] scope and 30 degree lens.  Examination revealed a normal urethra.  The external sphincter was intact.  The prostatic urethra short with bilobar hyperplasia and some obstruction.  The prostatic mucosa had blanching consistent with prior radiation therapy.  The bladder wall had mild trabeculation with some radiation telangiectasias but no tumors or stones were seen.  The ureteral orifices were unremarkable.  The right ureteral orifice was cannulated with a 5 French opening catheter and contrast was instilled.  The right retrograde pyelogram demonstrated a normal caliber ureter to a filling defect in the lower proximal ureter consistent with a stone there was proximal dilation above the stone.  A sensor wire was then passed through the open-ended catheter by the stone into the kidney without difficulty and the opening catheter and  cystoscope were removed.  The 12 French inner core over 35 cm digital access sheath was then passed over the wire to the stone without difficulty this was followed by the assembled sheath.  Once the sheath was in position the inner core and wire were removed.  A dual-lumen digital flexible ureteroscope was then passed through the sheath and the stone was visualized in the proximal ureter with some edema distal to the stone.  The stone was in 2 fragments.  A 200 m tract tip laser fiber was passed through the scope and the stone was engaged with the smaller fragment breaking into 2 pieces but the larger fragment reflux into the kidney.  An engage basket was used to remove the smaller fragments.  I then advanced the ureteroscope to the kidney and fragmented the ureteral stone as well as a larger residual right upper pole stone.  Once the stones were adequately fragmented all significant fragments were removed.  Ureteroscope was removed and a 6 [place] open-ended catheter was passed to the kidney and a 10 mils syringe was used to aspirate the contents of the renal pelvis.  There were approximately 100 mL in the renal pelvis from the dilation.  I then advanced the ureteroscope and was able to visualize things more clearly.  I inspected the entire calyceal system and no significant stone fragments remained although there was a fair amount of dust in the upper pole from the fragmentation procedure.  A sensor wire was then advanced through the scope into the renal collecting system and then the scope was backed out under direct vision along with the sheath.  No significant ureteral injury was identified.  The cystoscope was then reinserted over the wire  and a 6 [place] by 26 cm contour double-J stent was then advanced to the kidney under fluoroscopic guidance.  The wire was removed, leaving a good coil in the kidney and a good coil in the bladder.  The bladder was drained and the cystoscope was removed.  He was taken  down from lithotomy position, his anesthetic was reversed and he was moved to recovery in stable condition.  There were no complications.  The stone fragments were given to the patient.

## 2019-06-02 NOTE — Transfer of Care (Signed)
Immediate Anesthesia Transfer of Care Note  Patient: Jeffrey Goodman  Procedure(s) Performed: Procedure(s) (LRB): CYSTOSCOPY WITH RETROGRADE PYELOGRAM, URETEROSCOPY AND STENT PLACEMENT (Right) HOLMIUM LASER APPLICATION (Right)  Patient Location: PACU  Anesthesia Type: General  Level of Consciousness: awake, sedated, patient cooperative and responds to stimulation  Airway & Oxygen Therapy: Patient Spontanous Breathing and Patient connected to [place] 02 and soft FM  Post-op Assessment: Report given to PACU RN, Post -op Vital signs reviewed and stable and Patient moving all extremities  Post vital signs: Reviewed and stable  Complications: No apparent anesthesia complications

## 2019-06-03 ENCOUNTER — Encounter (HOSPITAL_BASED_OUTPATIENT_CLINIC_OR_DEPARTMENT_OTHER): Payer: Self-pay | Admitting: Urology

## 2019-06-13 DIAGNOSIS — N202 Calculus of kidney with calculus of ureter: Secondary | ICD-10-CM | POA: Diagnosis not present

## 2019-06-16 DIAGNOSIS — H35371 Puckering of macula, right eye: Secondary | ICD-10-CM | POA: Diagnosis not present

## 2019-06-27 DIAGNOSIS — Z125 Encounter for screening for malignant neoplasm of prostate: Secondary | ICD-10-CM | POA: Diagnosis not present

## 2019-06-27 DIAGNOSIS — I10 Essential (primary) hypertension: Secondary | ICD-10-CM | POA: Diagnosis not present

## 2019-06-30 DIAGNOSIS — I10 Essential (primary) hypertension: Secondary | ICD-10-CM | POA: Diagnosis not present

## 2019-06-30 DIAGNOSIS — I440 Atrioventricular block, first degree: Secondary | ICD-10-CM | POA: Diagnosis not present

## 2019-06-30 DIAGNOSIS — M6208 Separation of muscle (nontraumatic), other site: Secondary | ICD-10-CM | POA: Diagnosis not present

## 2019-06-30 DIAGNOSIS — Z0001 Encounter for general adult medical examination with abnormal findings: Secondary | ICD-10-CM | POA: Diagnosis not present

## 2019-07-07 DIAGNOSIS — H2511 Age-related nuclear cataract, right eye: Secondary | ICD-10-CM | POA: Diagnosis not present

## 2019-07-07 DIAGNOSIS — H25811 Combined forms of age-related cataract, right eye: Secondary | ICD-10-CM | POA: Diagnosis not present

## 2019-07-07 HISTORY — PX: CATARACT EXTRACTION W/PHACO: SHX586

## 2019-07-28 DIAGNOSIS — Z8546 Personal history of malignant neoplasm of prostate: Secondary | ICD-10-CM | POA: Diagnosis not present

## 2019-07-28 DIAGNOSIS — N202 Calculus of kidney with calculus of ureter: Secondary | ICD-10-CM | POA: Diagnosis not present

## 2019-08-02 DIAGNOSIS — H35371 Puckering of macula, right eye: Secondary | ICD-10-CM | POA: Diagnosis not present

## 2019-10-19 DIAGNOSIS — Z8546 Personal history of malignant neoplasm of prostate: Secondary | ICD-10-CM | POA: Diagnosis not present

## 2019-10-26 DIAGNOSIS — N200 Calculus of kidney: Secondary | ICD-10-CM | POA: Diagnosis not present

## 2019-10-26 DIAGNOSIS — Z8546 Personal history of malignant neoplasm of prostate: Secondary | ICD-10-CM | POA: Diagnosis not present

## 2019-10-26 DIAGNOSIS — E349 Endocrine disorder, unspecified: Secondary | ICD-10-CM | POA: Diagnosis not present

## 2020-03-01 ENCOUNTER — Ambulatory Visit (INDEPENDENT_AMBULATORY_CARE_PROVIDER_SITE_OTHER): Payer: Medicare Other | Admitting: Ophthalmology

## 2020-03-01 ENCOUNTER — Encounter (INDEPENDENT_AMBULATORY_CARE_PROVIDER_SITE_OTHER): Payer: Self-pay | Admitting: Ophthalmology

## 2020-03-01 ENCOUNTER — Other Ambulatory Visit: Payer: Self-pay

## 2020-03-01 DIAGNOSIS — H35341 Macular cyst, hole, or pseudohole, right eye: Secondary | ICD-10-CM

## 2020-03-01 DIAGNOSIS — Z9889 Other specified postprocedural states: Secondary | ICD-10-CM

## 2020-03-01 DIAGNOSIS — H2512 Age-related nuclear cataract, left eye: Secondary | ICD-10-CM | POA: Insufficient documentation

## 2020-03-01 NOTE — Progress Notes (Signed)
03/01/2020     CHIEF COMPLAINT Patient presents for Retina Follow Up   HISTORY OF PRESENT ILLNESS: Jeffrey Goodman is a 69 y.o. male who presents to the clinic today for:   HPI    Retina Follow Up    Patient presents with  Other (Retinal Hole).  In right eye.  Severity is moderate.  Duration of 1 year.  Since onset it is stable.  I, the attending physician,  performed the HPI with the patient and updated documentation appropriately.          Comments    1 Year f\u OU. OCT  Pt states OD vision is slowly improving. Pt had cat sx on OD. Pt saw Dr. Ellie Lunch in Nov 2020       Last edited by Tilda Franco on 03/01/2020  1:29 PM. (History)      Referring physician: Deland Pretty, MD [address] [place],  [place] [address]  HISTORICAL INFORMATION:   Selected notes from the [facility]: No current outpatient medications on file. (Ophthalmic Drugs)   No current facility-administered medications for this visit. (Ophthalmic Drugs)   Current Outpatient Medications (Other)  Medication Sig  . acetaminophen (TYLENOL) 500 MG tablet Take 1,000 mg by mouth every 6 (six) hours as needed for pain.  Marland Kitchen amLODipine (NORVASC) 10 MG tablet Take 10 mg by mouth at bedtime.   . hydrochlorothiazide (HYDRODIURIL) 25 MG tablet Take 25 mg by mouth daily.  Marland Kitchen HYDROcodone-acetaminophen (NORCO/VICODIN) 5-325 MG tablet Take 1-2 tablets by mouth every 6 (six) hours as needed.  Marland Kitchen olmesartan (BENICAR) 40 MG tablet Take 40 mg by mouth daily.  Marland Kitchen oxymetazoline (AFRIN) 0.05 % nasal spray Place 1 spray into the nose 2 (two) times daily as needed.   . phenazopyridine (PYRIDIUM) 200 MG tablet Take 1 tablet (200 mg total) by mouth 3 (three) times daily as needed for pain.  . tamsulosin (FLOMAX) 0.4 MG CAPS capsule Take 0.4 mg by mouth daily.    No current facility-administered medications for this visit. (Other)      REVIEW OF  SYSTEMS:    ALLERGIES No Known Allergies  PAST MEDICAL HISTORY Past Medical History:  Diagnosis Date  . BPH (benign prostatic hyperplasia)   . Diverticulosis of colon   . Frequency of urination   . GERD (gastroesophageal reflux disease)    occasional  . Hemorrhoids   . History of chronic sinusitis   . History of external beam radiation therapy 10-30-2016 to 12-02-2016   prostate/ pelvis  45Gy in 25 fractions  . History of kidney stones   . Hypertension    cardiologist-  dr Einar Gip  . OA (osteoarthritis)    right shoulder  . Prostate cancer [facility]) urologist-  dr wrenn/ oncologist-- dr Tammi Klippel shadad   dx 10/ 2017-- Stage T2a, Gleason 4+5, PSA 13.79, vol 40cc--  post external beam radiation 10-30-2016 to 12-02-2016 and scheduled for radiactive prostate seed implants 12-23-2016  . Right renal stone   . Right ureteral stone   . Urgency of urination   . Wears glasses    Past Surgical History:  Procedure Laterality Date  . CATARACT EXTRACTION W/PHACO Right 07/07/2019   Dr. Ellie Lunch  . CYSTOSCOPY  12/23/2016   Procedure: CYSTOSCOPY FLEXIBLE;  Surgeon: Irine Seal, MD;  Location: [facility];  Service: Urology;;  no seeds found in bladder  . CYSTOSCOPY WITH RETROGRADE PYELOGRAM, URETEROSCOPY AND STENT PLACEMENT Right  06/02/2019   Procedure: CYSTOSCOPY WITH RETROGRADE PYELOGRAM, URETEROSCOPY AND STENT PLACEMENT;  Surgeon: Irine Seal, MD;  Location: [facility];  Service: Urology;  Laterality: Right;  . EXTRACORPOREAL SHOCK WAVE LITHOTRIPSY  2008;  2012;  2013  . EXTRACORPOREAL SHOCK WAVE LITHOTRIPSY Right 03/31/2019   Procedure: EXTRACORPOREAL SHOCK WAVE LITHOTRIPSY (ESWL);  Surgeon: Raynelle Bring, MD;  Location: [facility];  Service: Urology;  Laterality: Right;  . HOLMIUM LASER APPLICATION Right 1/54/0086   Procedure: HOLMIUM LASER APPLICATION;  Surgeon: Irine Seal, MD;  Location[facility];  Service: Urology;  Laterality: Right;  . PROSTATE  BIOPSY  06/23/2016  . RADIOACTIVE SEED IMPLANT N/A 12/23/2016   Procedure: RADIOACTIVE SEED IMPLANT/BRACHYTHERAPY IMPLANT and space oar placement;  Surgeon: Irine Seal, MD;  Location: [facility];  Service: Urology;  Laterality: N/A;  55 seeds implanted  . TOTAL KNEE ARTHROPLASTY Left 11/08/2012   Procedure: TOTAL KNEE ARTHROPLASTY;  Surgeon: [name], MD;  Location: [facility];  Service: Orthopedics;  Laterality: Left;  . TOTAL KNEE ARTHROPLASTY Right 03/19/2015   Procedure: TOTAL KNEE ARTHROPLASTY;  Surgeon: Gaynelle Arabian, MD;  Location: [facility];  Service: Orthopedics;  Laterality: Right;    FAMILY HISTORY History reviewed. No pertinent family history.  SOCIAL HISTORY Social History   Tobacco Use  . Smoking status: Never Smoker  . Smokeless tobacco: Former Systems developer    Types: Secondary school teacher  . Vaping Use: Never used  Substance Use Topics  . Alcohol use: No  . Drug use: No         OPHTHALMIC EXAM: Base Eye Exam    Visual Acuity (Snellen - Linear)      Right Left   Dist cc 20/40 + 20/20 -1   Dist ph cc 20/20 -2    Correction: Glasses       Tonometry (Tonopen, 1:36 PM)      Right Left   Pressure 12 13       Pupils      Pupils Dark Light Shape React APD   Right PERRL 3 3 Round Minimal None   Left PERRL 3 3 Round Minimal None       Visual Fields (Counting fingers)      Left Right    Full Full       Neuro/Psych    Oriented x3: Yes   Mood/Affect: Normal       Dilation    Both eyes: 1.0% Mydriacyl, 2.5% Phenylephrine @ 1:36 PM        Slit Lamp and Fundus Exam    Slit Lamp Exam      Right Left   Lens Centered posterior chamber intraocular lens           IMAGING AND PROCEDURES  Imaging and Procedures for 03/01/20           ASSESSMENT/PLAN:  No problem-specific Assessment & Plan notes found for this encounter.      ICD-10-CM   1. Macular hole of right eye  H35.341 OCT, Retina - OU - Both Eyes  2. Nuclear sclerotic cataract of  left eye  H25.12     1.  2.  3.  Ophthalmic Meds Ordered this visit:  No orders of the defined types were placed in this encounter.      No follow-ups on file.  There are no Patient Instructions on file for this visit.   Explained the diagnoses, plan, and follow up with the patient and they expressed understanding.  Patient expressed understanding of the importance of proper follow up care.   Clent Demark Nalaya Wojdyla M.D. Diseases & Surgery of the Retina and Vitreous Retina & Diabetic [place] 03/01/20     Abbreviations: M myopia (nearsighted); A astigmatism; H hyperopia (farsighted); P presbyopia; Mrx spectacle prescription;  CTL contact lenses; OD right eye; OS left eye; OU both eyes  XT exotropia; ET esotropia; PEK punctate epithelial keratitis; PEE punctate epithelial erosions; DES dry eye syndrome; MGD meibomian gland dysfunction; ATs artificial tears; PFAT's preservative free artificial tears; [place] nuclear sclerotic cataract; PSC posterior subcapsular cataract; ERM epi-retinal membrane; PVD posterior vitreous detachment; RD retinal detachment; DM diabetes mellitus; DR diabetic retinopathy; NPDR non-proliferative diabetic retinopathy; PDR proliferative diabetic retinopathy; CSME clinically significant macular edema; DME diabetic macular edema; dbh dot blot hemorrhages; CWS cotton wool spot; POAG primary open angle glaucoma; C/D cup-to-disc ratio; HVF humphrey visual field; GVF goldmann visual field; OCT optical coherence tomography; IOP intraocular pressure; BRVO Branch retinal vein occlusion; CRVO central retinal vein occlusion; CRAO central retinal artery occlusion; BRAO branch retinal artery occlusion; RT retinal tear; SB scleral buckle; PPV pars plana vitrectomy; VH Vitreous hemorrhage; PRP panretinal laser photocoagulation; IVK intravitreal kenalog; VMT vitreomacular traction; MH Macular hole;  NVD neovascularization of the disc; NVE neovascularization elsewhere; AREDS age related eye  disease study; ARMD age related macular degeneration; POAG primary open angle glaucoma; EBMD epithelial/anterior basement membrane dystrophy; ACIOL anterior chamber intraocular lens; IOL intraocular lens; PCIOL posterior chamber intraocular lens; Phaco/IOL phacoemulsification with intraocular lens placement; [place] photorefractive keratectomy; LASIK laser assisted in situ keratomileusis; HTN hypertension; DM diabetes mellitus; COPD chronic obstructive pulmonary disease

## 2020-04-25 DIAGNOSIS — Z8546 Personal history of malignant neoplasm of prostate: Secondary | ICD-10-CM | POA: Diagnosis not present

## 2020-04-25 DIAGNOSIS — E349 Endocrine disorder, unspecified: Secondary | ICD-10-CM | POA: Diagnosis not present

## 2020-06-18 DIAGNOSIS — Z23 Encounter for immunization: Secondary | ICD-10-CM | POA: Diagnosis not present

## 2020-08-01 DIAGNOSIS — H5203 Hypermetropia, bilateral: Secondary | ICD-10-CM | POA: Diagnosis not present

## 2020-09-26 DIAGNOSIS — I10 Essential (primary) hypertension: Secondary | ICD-10-CM | POA: Diagnosis not present

## 2020-09-26 DIAGNOSIS — Z125 Encounter for screening for malignant neoplasm of prostate: Secondary | ICD-10-CM | POA: Diagnosis not present

## 2020-10-01 DIAGNOSIS — Z0000 Encounter for general adult medical examination without abnormal findings: Secondary | ICD-10-CM | POA: Diagnosis not present

## 2020-10-01 DIAGNOSIS — Z923 Personal history of irradiation: Secondary | ICD-10-CM | POA: Diagnosis not present

## 2020-10-01 DIAGNOSIS — R7303 Prediabetes: Secondary | ICD-10-CM | POA: Diagnosis not present

## 2020-10-01 DIAGNOSIS — I10 Essential (primary) hypertension: Secondary | ICD-10-CM | POA: Diagnosis not present

## 2020-10-04 ENCOUNTER — Other Ambulatory Visit: Payer: Self-pay | Admitting: Internal Medicine

## 2020-10-04 DIAGNOSIS — I10 Essential (primary) hypertension: Secondary | ICD-10-CM

## 2020-10-15 ENCOUNTER — Ambulatory Visit: Payer: Medicare Other | Admitting: Family Medicine

## 2020-10-15 ENCOUNTER — Encounter: Payer: Self-pay | Admitting: Family Medicine

## 2020-10-15 ENCOUNTER — Other Ambulatory Visit: Payer: Self-pay

## 2020-10-15 VITALS — BP 142/86 | Ht 70.0 in | Wt 235.0 lb

## 2020-10-15 DIAGNOSIS — G8929 Other chronic pain: Secondary | ICD-10-CM | POA: Diagnosis not present

## 2020-10-15 DIAGNOSIS — M25511 Pain in right shoulder: Secondary | ICD-10-CM

## 2020-10-15 NOTE — Progress Notes (Signed)
PCP: Deland Pretty, MD  Subjective:   HPI: Patient is a 70 y.o. male here for right shoulder pain.  Patient has longstanding history of right shoulder pain, reports known arthritis going back at least 13 years. He's had steroid injections and synvisc in past. Most recent steroid injection was about 2 years ago and only provided relief for about 3 weeks. He is right handed and enjoys skeet shooting - told if he had this replaced he would have to give this up. Takes supplements including a collagen support supplement with a probiotic. No new injuries. No obvious swelling.  Past Medical History:  Diagnosis Date  . BPH (benign prostatic hyperplasia)   . Diverticulosis of colon   . Frequency of urination   . GERD (gastroesophageal reflux disease)    occasional  . Hemorrhoids   . History of chronic sinusitis   . History of external beam radiation therapy 10-30-2016 to 12-02-2016   prostate/ pelvis  45Gy in 25 fractions  . History of kidney stones   . Hypertension    cardiologist-  dr Einar Gip  . OA (osteoarthritis)    right shoulder  . Prostate cancer [facility]) urologist-  dr wrenn/ oncologist-- dr Tammi Klippel shadad   dx 10/ 2017-- Stage T2a, Gleason 4+5, PSA 13.79, vol 40cc--  post external beam radiation 10-30-2016 to 12-02-2016 and scheduled for radiactive prostate seed implants 12-23-2016  . Right renal stone   . Right ureteral stone   . Urgency of urination   . Wears glasses     Current Outpatient Medications on File Prior to Visit  Medication Sig Dispense Refill  . acetaminophen (TYLENOL) 500 MG tablet Take 1,000 mg by mouth every 6 (six) hours as needed for pain.    Marland Kitchen amLODipine (NORVASC) 10 MG tablet Take 10 mg by mouth at bedtime.   99  . HYDROcodone-acetaminophen (NORCO/VICODIN) 5-325 MG tablet Take 1-2 tablets by mouth every 6 (six) hours as needed. 20 tablet 0  . olmesartan-hydrochlorothiazide (BENICAR HCT) 40-25 MG tablet Take 1 tablet by mouth daily.    Marland Kitchen oxymetazoline  (AFRIN) 0.05 % nasal spray Place 1 spray into the nose 2 (two) times daily as needed.     . phenazopyridine (PYRIDIUM) 200 MG tablet Take 1 tablet (200 mg total) by mouth 3 (three) times daily as needed for pain. 10 tablet 0  . tamsulosin (FLOMAX) 0.4 MG CAPS capsule Take 0.4 mg by mouth daily.      No current facility-administered medications on file prior to visit.    Past Surgical History:  Procedure Laterality Date  . CATARACT EXTRACTION W/PHACO Right 07/07/2019   Dr. Ellie Lunch  . CYSTOSCOPY  12/23/2016   Procedure: CYSTOSCOPY FLEXIBLE;  Surgeon: Irine Seal, MD;  Location: [facility];  Service: Urology;;  no seeds found in bladder  . CYSTOSCOPY WITH RETROGRADE PYELOGRAM, URETEROSCOPY AND STENT PLACEMENT Right 06/02/2019   Procedure: CYSTOSCOPY WITH RETROGRADE PYELOGRAM, URETEROSCOPY AND STENT PLACEMENT;  Surgeon: Irine Seal, MD;  Location: [facility];  Service: Urology;  Laterality: Right;  . EXTRACORPOREAL SHOCK WAVE LITHOTRIPSY  2008;  2012;  2013  . EXTRACORPOREAL SHOCK WAVE LITHOTRIPSY Right 03/31/2019   Procedure: EXTRACORPOREAL SHOCK WAVE LITHOTRIPSY (ESWL);  Surgeon: Raynelle Bring, MD;  Location: [facility];  Service: Urology;  Laterality: Right;  . HOLMIUM LASER APPLICATION Right 0/05/2329   Procedure: HOLMIUM LASER APPLICATION;  Surgeon: Irine Seal, MD;  Location: [facility];  Service: Urology;  Laterality: Right;  . PROSTATE BIOPSY  06/23/2016  .  RADIOACTIVE SEED IMPLANT N/A 12/23/2016   Procedure: RADIOACTIVE SEED IMPLANT/BRACHYTHERAPY IMPLANT and space oar placement;  Surgeon: Irine Seal, MD;  Location: [facility];  Service: Urology;  Laterality: N/A;  55 seeds implanted  . TOTAL KNEE ARTHROPLASTY Left 11/08/2012   Procedure: TOTAL KNEE ARTHROPLASTY;  Surgeon: [name], MD;  Location: [facility];  Service: Orthopedics;  Laterality: Left;  . TOTAL KNEE ARTHROPLASTY Right 03/19/2015   Procedure: TOTAL KNEE ARTHROPLASTY;   Surgeon: Gaynelle Arabian, MD;  Location: [facility];  Service: Orthopedics;  Laterality: Right;    No Known Allergies  Social History   Socioeconomic History  . Marital status: Divorced    Spouse name: Not on file  . Number of children: Not on file  . Years of education: Not on file  . Highest education level: Not on file  Occupational History  . Not on file  Tobacco Use  . Smoking status: Never Smoker  . Smokeless tobacco: Former Systems developer    Types: Secondary school teacher  . Vaping Use: Never used  Substance and Sexual Activity  . Alcohol use: No  . Drug use: No  . Sexual activity: Not on file  Other Topics Concern  . Not on file  Social History Narrative  . Not on file   Social Determinants of Health   Financial Resource Strain: Not on file  Food Insecurity: Not on file  Transportation Needs: Not on file  Physical Activity: Not on file  Stress: Not on file  Social Connections: Not on file  Intimate Partner Violence: Not on file    History reviewed. No pertinent family history.  BP (!) 142/86   Ht 5\' 10"  (1.778 m)   Wt 235 lb (106.6 kg)   BMI 33.72 kg/m   No flowsheet data found.  No flowsheet data found.  Review of Systems: See HPI above.     Objective:  Physical Exam:  Gen: NAD, comfortable in exam room  Right shoulder: No swelling, ecchymoses.  No gross deformity. No TTP. ROM limited to 120 degrees flexion and abduction.  Full IR.  ER 50 degrees. Negative Hawkins, Neers. Strength 5-/5 with empty can and resisted external rotation.  5/5 IR. NV intact distally.   Assessment & Plan:  1. Right shoulder pain - patient with longstanding known osteoarthritis of glenohumeral joint, exam consistent with this as his cause of pain and no new injuries.  He will get his x-rays from 2 years ago on a disc to document this - do not think we need to repeat these.  He is not interested in surgical intervention.  Last steroid injection provided only 3 weeks of relief.  Has done  extensive physical therapy.  Discussed medications, supplements.  He is interested in trying PRP after discussion of this.  See instructions for further.

## 2020-10-15 NOTE — Patient Instructions (Signed)
Get your x-rays on a disc and drop them off up front when you can. We will set you up to start PRP at our [facility] location.  These are the general arthritis instructions: Your pain is due to arthritis. These are the different medications you can take for this: Tylenol 500mg  1-2 tabs three times a day for pain. Capsaicin, aspercreme, or biofreeze topically up to four times a day may also help with pain. Some supplements that may help for arthritis: Boswellia extract, curcumin, pycnogenol Aleve 1-2 tabs twice a day with food as needed. Cortisone injections are an option. It's important that you continue to stay active. Consider physical therapy to strengthen muscles around the joint that hurts to take pressure off of the joint itself. Heat or ice 15 minutes at a time 3-4 times a day as needed to help with pain. Water aerobics and cycling with low resistance are the best two types of exercise for arthritis though any exercise is ok as long as it doesn't worsen the pain.

## 2020-10-19 DIAGNOSIS — Z8546 Personal history of malignant neoplasm of prostate: Secondary | ICD-10-CM | POA: Diagnosis not present

## 2020-10-26 DIAGNOSIS — C61 Malignant neoplasm of prostate: Secondary | ICD-10-CM | POA: Diagnosis not present

## 2020-10-26 DIAGNOSIS — N200 Calculus of kidney: Secondary | ICD-10-CM | POA: Diagnosis not present

## 2020-10-26 DIAGNOSIS — R9721 Rising PSA following treatment for malignant neoplasm of prostate: Secondary | ICD-10-CM | POA: Diagnosis not present

## 2020-10-31 ENCOUNTER — Other Ambulatory Visit (HOSPITAL_COMMUNITY): Payer: Self-pay | Admitting: Urology

## 2020-10-31 DIAGNOSIS — C61 Malignant neoplasm of prostate: Secondary | ICD-10-CM

## 2020-10-31 DIAGNOSIS — R9721 Rising PSA following treatment for malignant neoplasm of prostate: Secondary | ICD-10-CM

## 2020-11-13 ENCOUNTER — Ambulatory Visit (HOSPITAL_COMMUNITY)
Admission: RE | Admit: 2020-11-13 | Discharge: 2020-11-13 | Disposition: A | Discharge status: Home / Self Care | Payer: Medicare Other | Source: Ambulatory Visit | Attending: Urology | Admitting: Urology

## 2020-11-13 ENCOUNTER — Other Ambulatory Visit: Payer: Self-pay

## 2020-11-13 DIAGNOSIS — R9721 Rising PSA following treatment for malignant neoplasm of prostate: Secondary | ICD-10-CM | POA: Diagnosis not present

## 2020-11-13 DIAGNOSIS — C61 Malignant neoplasm of prostate: Secondary | ICD-10-CM | POA: Insufficient documentation

## 2020-11-13 MED ORDER — PIFLIFOLASTAT F 18 (PYLARIFY) INJECTION
9.0000 | Freq: Once | INTRAVENOUS | Status: AC
  Administered 2020-11-13: 9.8 via INTRAVENOUS

## 2021-01-16 DIAGNOSIS — C61 Malignant neoplasm of prostate: Secondary | ICD-10-CM | POA: Diagnosis not present

## 2021-01-25 DIAGNOSIS — C61 Malignant neoplasm of prostate: Secondary | ICD-10-CM | POA: Diagnosis not present

## 2021-01-28 ENCOUNTER — Other Ambulatory Visit (HOSPITAL_COMMUNITY): Payer: Self-pay | Admitting: Urology

## 2021-01-28 ENCOUNTER — Other Ambulatory Visit: Payer: Self-pay | Admitting: Urology

## 2021-01-28 DIAGNOSIS — R9721 Rising PSA following treatment for malignant neoplasm of prostate: Secondary | ICD-10-CM

## 2021-01-28 DIAGNOSIS — C61 Malignant neoplasm of prostate: Secondary | ICD-10-CM

## 2021-02-05 ENCOUNTER — Other Ambulatory Visit: Payer: Self-pay

## 2021-02-05 ENCOUNTER — Ambulatory Visit
Admission: RE | Admit: 2021-02-05 | Discharge: 2021-02-05 | Disposition: A | Discharge status: Home / Self Care | Payer: Self-pay | Source: Ambulatory Visit | Attending: Internal Medicine | Admitting: Internal Medicine

## 2021-02-05 DIAGNOSIS — I10 Essential (primary) hypertension: Secondary | ICD-10-CM | POA: Diagnosis not present

## 2021-02-08 ENCOUNTER — Encounter (HOSPITAL_COMMUNITY)
Admission: RE | Admit: 2021-02-08 | Discharge: 2021-02-08 | Disposition: A | Discharge status: Home / Self Care | Payer: Medicare Other | Source: Ambulatory Visit | Attending: Urology | Admitting: Urology

## 2021-02-08 ENCOUNTER — Ambulatory Visit (HOSPITAL_COMMUNITY)
Admission: RE | Admit: 2021-02-08 | Discharge: 2021-02-08 | Disposition: A | Discharge status: Home / Self Care | Payer: Medicare Other | Source: Ambulatory Visit | Attending: Urology | Admitting: Urology

## 2021-02-08 ENCOUNTER — Other Ambulatory Visit: Payer: Self-pay

## 2021-02-08 DIAGNOSIS — R9721 Rising PSA following treatment for malignant neoplasm of prostate: Secondary | ICD-10-CM | POA: Diagnosis not present

## 2021-02-08 DIAGNOSIS — C61 Malignant neoplasm of prostate: Secondary | ICD-10-CM | POA: Diagnosis not present

## 2021-02-08 MED ORDER — TECHNETIUM TC 99M MEDRONATE IV KIT
20.9000 | PACK | Freq: Once | INTRAVENOUS | Status: AC | PRN
  Administered 2021-02-08: 20.9 via INTRAVENOUS

## 2021-02-18 ENCOUNTER — Ambulatory Visit
Admission: RE | Admit: 2021-02-18 | Discharge: 2021-02-18 | Disposition: A | Discharge status: Home / Self Care | Payer: Medicare Other | Source: Ambulatory Visit | Attending: Urology | Admitting: Urology

## 2021-02-18 ENCOUNTER — Other Ambulatory Visit: Payer: Self-pay | Admitting: Urology

## 2021-02-18 DIAGNOSIS — R9721 Rising PSA following treatment for malignant neoplasm of prostate: Secondary | ICD-10-CM

## 2021-02-18 DIAGNOSIS — R972 Elevated prostate specific antigen [PSA]: Secondary | ICD-10-CM | POA: Diagnosis not present

## 2021-02-18 DIAGNOSIS — C61 Malignant neoplasm of prostate: Secondary | ICD-10-CM | POA: Diagnosis not present

## 2021-03-01 DIAGNOSIS — C61 Malignant neoplasm of prostate: Secondary | ICD-10-CM | POA: Diagnosis not present

## 2021-03-04 ENCOUNTER — Ambulatory Visit (HOSPITAL_COMMUNITY)
Admission: RE | Admit: 2021-03-04 | Discharge: 2021-03-04 | Disposition: A | Discharge status: Home / Self Care | Payer: Medicare Other | Source: Ambulatory Visit | Attending: Urology | Admitting: Urology

## 2021-03-04 ENCOUNTER — Other Ambulatory Visit: Payer: Self-pay

## 2021-03-04 ENCOUNTER — Other Ambulatory Visit (HOSPITAL_COMMUNITY): Payer: Self-pay | Admitting: Urology

## 2021-03-04 DIAGNOSIS — C61 Malignant neoplasm of prostate: Secondary | ICD-10-CM

## 2021-03-08 ENCOUNTER — Encounter (HOSPITAL_COMMUNITY): Payer: Self-pay | Admitting: Emergency Medicine

## 2021-03-08 ENCOUNTER — Ambulatory Visit (HOSPITAL_COMMUNITY)
Admission: EM | Admit: 2021-03-08 | Discharge: 2021-03-08 | Disposition: A | Discharge status: Home / Self Care | Payer: Medicare Other | Attending: Physician Assistant | Admitting: Physician Assistant

## 2021-03-08 ENCOUNTER — Ambulatory Visit (INDEPENDENT_AMBULATORY_CARE_PROVIDER_SITE_OTHER): Payer: Medicare Other

## 2021-03-08 ENCOUNTER — Other Ambulatory Visit: Payer: Self-pay

## 2021-03-08 DIAGNOSIS — M549 Dorsalgia, unspecified: Secondary | ICD-10-CM

## 2021-03-08 DIAGNOSIS — M545 Low back pain, unspecified: Secondary | ICD-10-CM

## 2021-03-08 DIAGNOSIS — M546 Pain in thoracic spine: Secondary | ICD-10-CM | POA: Diagnosis not present

## 2021-03-08 DIAGNOSIS — M542 Cervicalgia: Secondary | ICD-10-CM

## 2021-03-08 DIAGNOSIS — Z041 Encounter for examination and observation following transport accident: Secondary | ICD-10-CM | POA: Diagnosis not present

## 2021-03-08 DIAGNOSIS — M5031 Other cervical disc degeneration,  high cervical region: Secondary | ICD-10-CM | POA: Diagnosis not present

## 2021-03-08 MED ORDER — TIZANIDINE HCL 4 MG PO CAPS: 4.0000 mg | ORAL_CAPSULE | Freq: Three times a day (TID) | ORAL | 0 refills | Status: AC | PRN
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
## 2021-04-02 DIAGNOSIS — C61 Malignant neoplasm of prostate: Secondary | ICD-10-CM | POA: Diagnosis not present

## 2021-04-09 DIAGNOSIS — Z5111 Encounter for antineoplastic chemotherapy: Secondary | ICD-10-CM | POA: Diagnosis not present

## 2021-04-09 DIAGNOSIS — C61 Malignant neoplasm of prostate: Secondary | ICD-10-CM | POA: Diagnosis not present

## 2021-05-03 IMAGING — CT NM PET TUM IMG SKULL BASE T - THIGH
7 series · 25 of 25 positions shown · non-contrast
Comparison: Pelvic MRI [DATE]

CLINICAL DATA: Prostate carcinoma with biochemical recurrence.
Rising PSA. Radiation seed implantation.

EXAM:
NUCLEAR MEDICINE PET SKULL BASE TO THIGH
TECHNIQUE: 9.8 mCi F18 Piflufolastat (Pylarify) was injected intravenously.
Full-ring PET imaging was performed from the skull base to thigh
after the radiotracer. CT data was obtained and used for attenuation
correction and anatomic localization.

[Series 3: pet sk_thigh ac · axial · 5.0mm · 4.07mm/px · z∈[-1185,-241]mm · 6 of 237 slices shown]
[im 1/237]
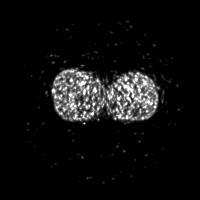
[im 48/237]
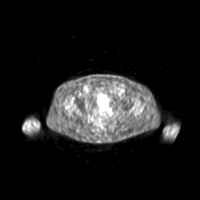
[im 95/237]
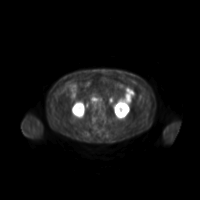
[im 142/237]
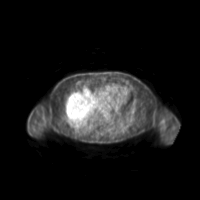
[im 189/237]
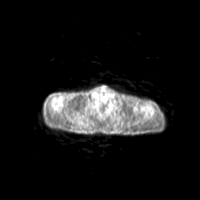
[im 237/237]
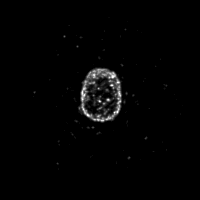

[Series 4: ct sk_thigh 5.0 bf37 · axial · 5.0mm · 0.98mm/px · z∈[-1185,-241]mm · 6 of 237 slices shown]
[im 1/237]
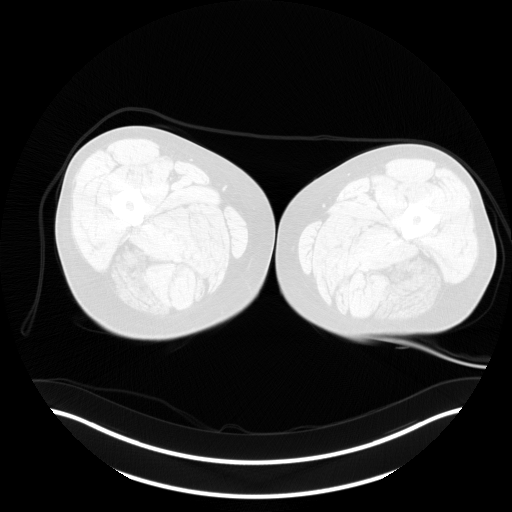
[im 48/237]
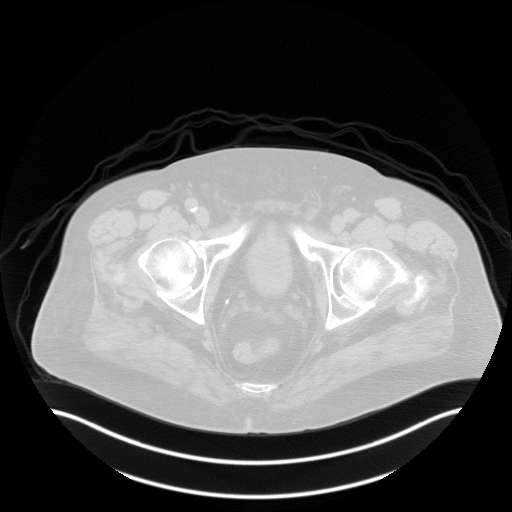
[im 95/237]
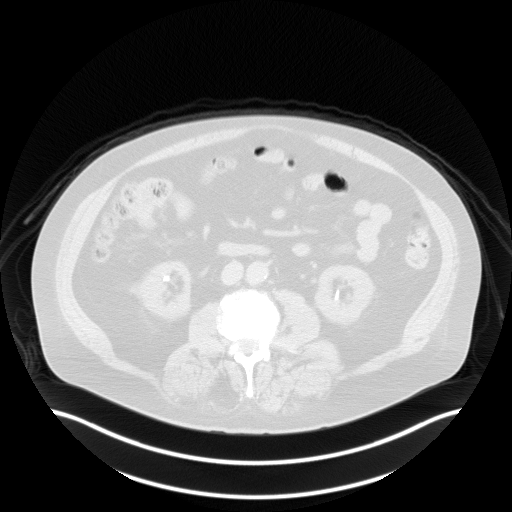
[im 142/237]
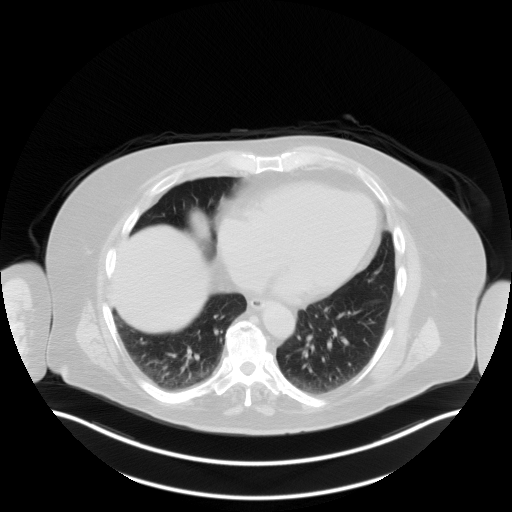
[im 189/237  soft-tissue]
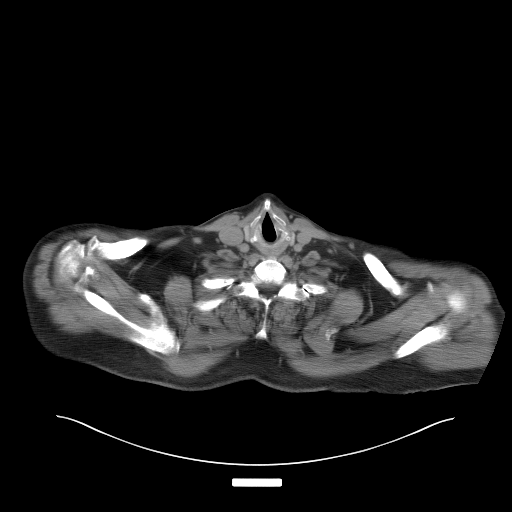
[im 237/237  brain]
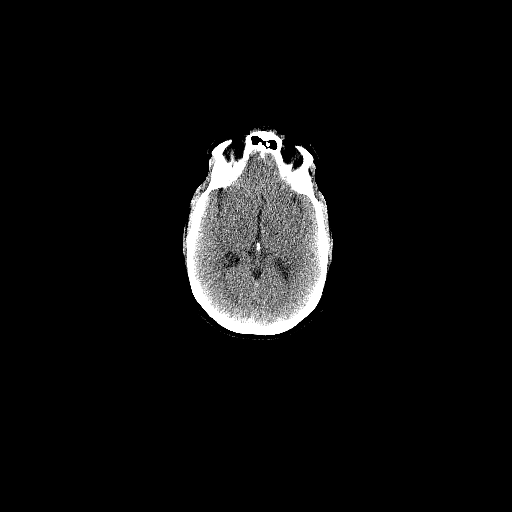

[Series 5: pet sk_thigh nac · axial · 5.0mm · 4.07mm/px · z∈[-1185,-241]mm · 5 of 237 slices shown]
[im 1/237]
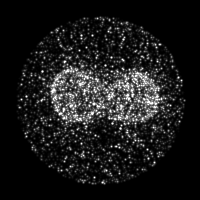
[im 60/237]
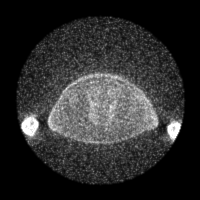
[im 119/237]
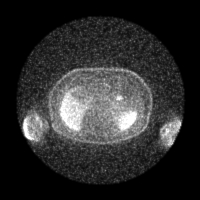
[im 178/237]
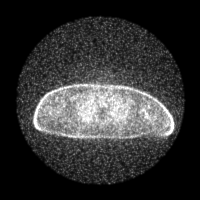
[im 237/237]
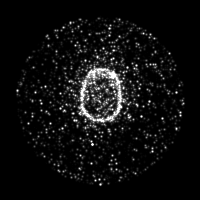

[Series 8: ct sk_thigh 5.0 br59 lung_bone · axial · 5.0mm · 0.69mm/px · 1 of 65 slices shown]
[im 1/65]
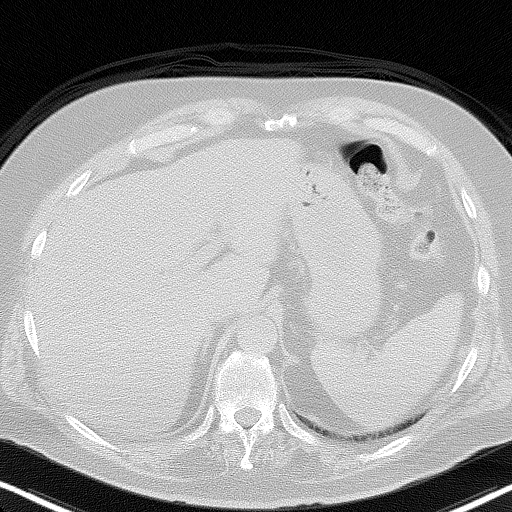

[Series 603: fused cor · 1 of 48 slices shown]
[im 1/48]
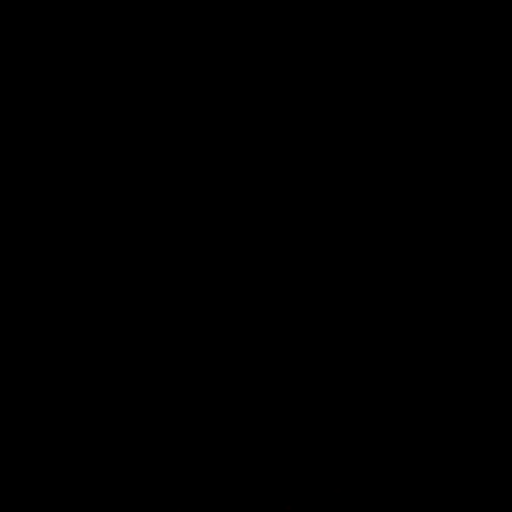

[Series 604: <mip collection> · coronal · 1.96mm/px · 1 of 32 slices shown]
[im 1/32]
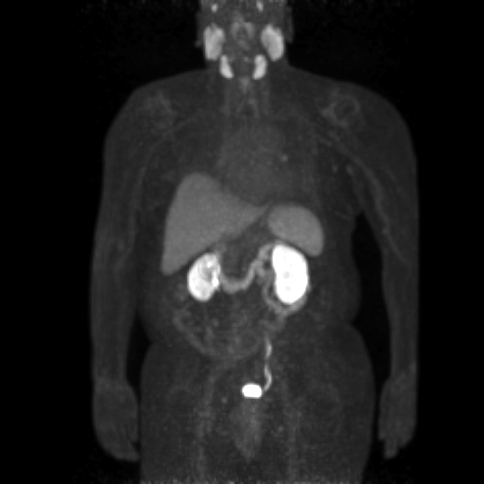

[Series 605: range-ct sk_thigh 5.0 bf37-tra-<alpha range> · 5 of 231 slices shown]
[im 1/231]
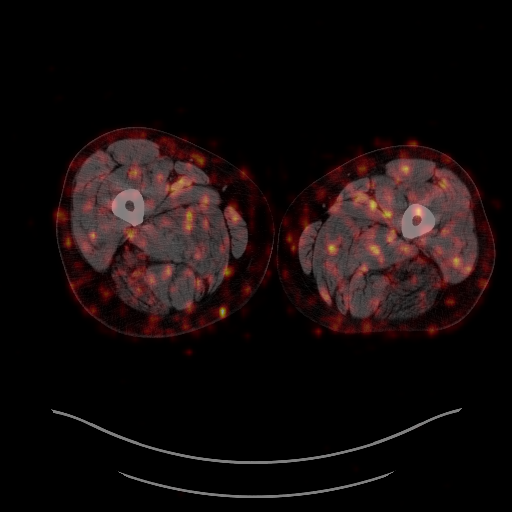
[im 58/231]
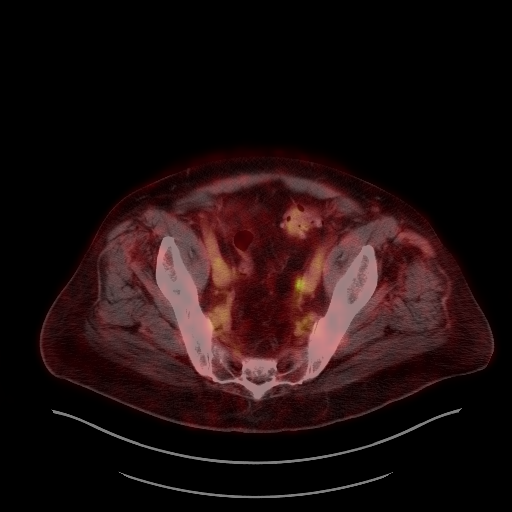
[im 116/231]
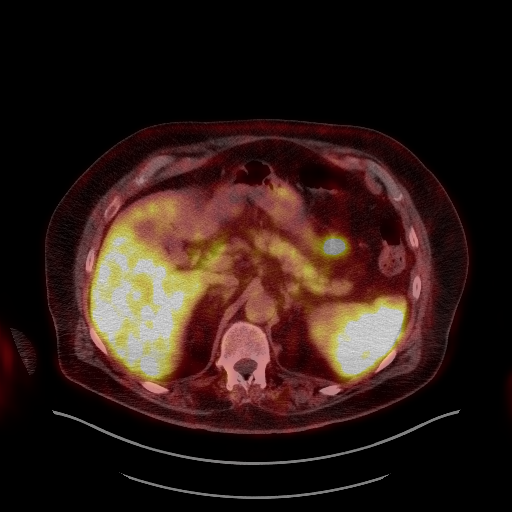
[im 173/231]
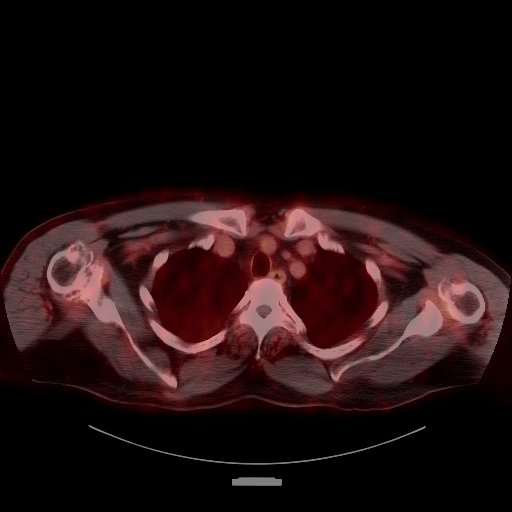
[im 231/231]
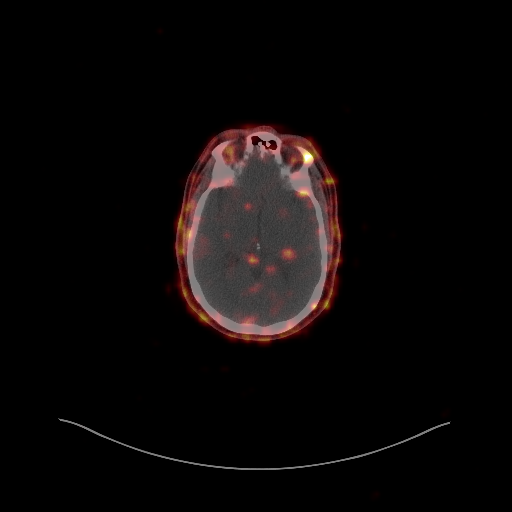

[25 of 25 positions shown; findings below may reference images not displayed]

FINDINGS: NECK

No radiotracer activity in neck lymph nodes.

Incidental CT finding: None

CHEST

No radiotracer accumulation within mediastinal or hilar lymph nodes.
No suspicious pulmonary nodules on the CT scan.

Incidental CT finding: None

ABDOMEN/PELVIS

Prostate: No focal activity within the prostate gland. Multiple
brachytherapy seeds noted.

Lymph nodes: No abnormal radiotracer accumulation within pelvic or
abdominal nodes.

Liver: No evidence of liver metastasis

Incidental CT finding: None

SKELETON

No focal  activity to suggest skeletal metastasis.
IMPRESSION: 1. No evidence of local recurrence within the prostate gland.
Brachytherapy seeds noted.
2. No evidence of pelvic nodal metastasis or retroperitoneal
periaortic nodal metastasis.
3. No evidence of visceral metastasis or skeletal metastasis.

## 2021-05-16 DIAGNOSIS — C61 Malignant neoplasm of prostate: Secondary | ICD-10-CM | POA: Diagnosis not present

## 2021-07-23 DIAGNOSIS — R918 Other nonspecific abnormal finding of lung field: Secondary | ICD-10-CM | POA: Diagnosis not present

## 2021-07-23 DIAGNOSIS — I2584 Coronary atherosclerosis due to calcified coronary lesion: Secondary | ICD-10-CM | POA: Diagnosis not present

## 2021-07-23 DIAGNOSIS — Z6834 Body mass index (BMI) 34.0-34.9, adult: Secondary | ICD-10-CM | POA: Diagnosis not present

## 2021-07-23 DIAGNOSIS — I2510 Atherosclerotic heart disease of native coronary artery without angina pectoris: Secondary | ICD-10-CM | POA: Diagnosis not present

## 2021-07-24 ENCOUNTER — Other Ambulatory Visit: Payer: Self-pay | Admitting: Internal Medicine

## 2021-07-24 DIAGNOSIS — R918 Other nonspecific abnormal finding of lung field: Secondary | ICD-10-CM

## 2021-08-14 ENCOUNTER — Ambulatory Visit
Admission: RE | Admit: 2021-08-14 | Discharge: 2021-08-14 | Disposition: A | Discharge status: Home / Self Care | Payer: Medicare Other | Source: Ambulatory Visit | Attending: Internal Medicine | Admitting: Internal Medicine

## 2021-08-14 DIAGNOSIS — I700 Atherosclerosis of aorta: Secondary | ICD-10-CM | POA: Diagnosis not present

## 2021-08-14 DIAGNOSIS — R911 Solitary pulmonary nodule: Secondary | ICD-10-CM | POA: Diagnosis not present

## 2021-08-14 DIAGNOSIS — R918 Other nonspecific abnormal finding of lung field: Secondary | ICD-10-CM | POA: Diagnosis not present

## 2021-08-29 DIAGNOSIS — C61 Malignant neoplasm of prostate: Secondary | ICD-10-CM | POA: Diagnosis not present

## 2021-09-12 DIAGNOSIS — R0989 Other specified symptoms and signs involving the circulatory and respiratory systems: Secondary | ICD-10-CM | POA: Diagnosis not present

## 2021-09-12 DIAGNOSIS — R051 Acute cough: Secondary | ICD-10-CM | POA: Diagnosis not present

## 2021-10-04 DIAGNOSIS — R7303 Prediabetes: Secondary | ICD-10-CM | POA: Diagnosis not present

## 2021-10-04 DIAGNOSIS — I10 Essential (primary) hypertension: Secondary | ICD-10-CM | POA: Diagnosis not present

## 2021-10-04 DIAGNOSIS — Z125 Encounter for screening for malignant neoplasm of prostate: Secondary | ICD-10-CM | POA: Diagnosis not present

## 2021-10-08 DIAGNOSIS — Z0000 Encounter for general adult medical examination without abnormal findings: Secondary | ICD-10-CM | POA: Diagnosis not present

## 2021-10-08 DIAGNOSIS — Z6834 Body mass index (BMI) 34.0-34.9, adult: Secondary | ICD-10-CM | POA: Diagnosis not present

## 2021-10-08 DIAGNOSIS — I10 Essential (primary) hypertension: Secondary | ICD-10-CM | POA: Diagnosis not present

## 2021-10-08 DIAGNOSIS — I440 Atrioventricular block, first degree: Secondary | ICD-10-CM | POA: Diagnosis not present

## 2021-10-10 ENCOUNTER — Other Ambulatory Visit: Payer: Self-pay | Admitting: Internal Medicine

## 2021-10-10 DIAGNOSIS — R918 Other nonspecific abnormal finding of lung field: Secondary | ICD-10-CM

## 2021-10-17 DIAGNOSIS — C61 Malignant neoplasm of prostate: Secondary | ICD-10-CM | POA: Diagnosis not present

## 2021-10-17 DIAGNOSIS — Z5111 Encounter for antineoplastic chemotherapy: Secondary | ICD-10-CM | POA: Diagnosis not present

## 2021-10-17 DIAGNOSIS — M8580 Other specified disorders of bone density and structure, unspecified site: Secondary | ICD-10-CM | POA: Diagnosis not present

## 2021-11-05 DIAGNOSIS — M8589 Other specified disorders of bone density and structure, multiple sites: Secondary | ICD-10-CM | POA: Diagnosis not present

## 2021-11-05 DIAGNOSIS — M85859 Other specified disorders of bone density and structure, unspecified thigh: Secondary | ICD-10-CM | POA: Diagnosis not present

## 2021-11-05 DIAGNOSIS — I2584 Coronary atherosclerosis due to calcified coronary lesion: Secondary | ICD-10-CM | POA: Diagnosis not present

## 2021-11-05 DIAGNOSIS — I10 Essential (primary) hypertension: Secondary | ICD-10-CM | POA: Diagnosis not present

## 2021-11-12 DIAGNOSIS — K625 Hemorrhage of anus and rectum: Secondary | ICD-10-CM | POA: Diagnosis not present

## 2021-11-12 DIAGNOSIS — C61 Malignant neoplasm of prostate: Secondary | ICD-10-CM | POA: Diagnosis not present

## 2021-11-12 DIAGNOSIS — K648 Other hemorrhoids: Secondary | ICD-10-CM | POA: Diagnosis not present

## 2022-01-02 DIAGNOSIS — C61 Malignant neoplasm of prostate: Secondary | ICD-10-CM | POA: Diagnosis not present

## 2022-01-09 DIAGNOSIS — C61 Malignant neoplasm of prostate: Secondary | ICD-10-CM | POA: Diagnosis not present

## 2022-02-11 DIAGNOSIS — K5730 Diverticulosis of large intestine without perforation or abscess without bleeding: Secondary | ICD-10-CM | POA: Diagnosis not present

## 2022-02-11 DIAGNOSIS — K625 Hemorrhage of anus and rectum: Secondary | ICD-10-CM | POA: Diagnosis not present

## 2022-02-11 DIAGNOSIS — K649 Unspecified hemorrhoids: Secondary | ICD-10-CM | POA: Diagnosis not present

## 2022-03-03 ENCOUNTER — Encounter (INDEPENDENT_AMBULATORY_CARE_PROVIDER_SITE_OTHER): Payer: Self-pay | Admitting: Ophthalmology

## 2022-03-03 ENCOUNTER — Ambulatory Visit (INDEPENDENT_AMBULATORY_CARE_PROVIDER_SITE_OTHER): Payer: Medicare Other | Admitting: Ophthalmology

## 2022-03-03 DIAGNOSIS — H35341 Macular cyst, hole, or pseudohole, right eye: Secondary | ICD-10-CM

## 2022-03-03 DIAGNOSIS — H02834 Dermatochalasis of left upper eyelid: Secondary | ICD-10-CM

## 2022-03-03 DIAGNOSIS — H02831 Dermatochalasis of right upper eyelid: Secondary | ICD-10-CM

## 2022-03-03 DIAGNOSIS — H2512 Age-related nuclear cataract, left eye: Secondary | ICD-10-CM

## 2022-03-07 DIAGNOSIS — H5202 Hypermetropia, left eye: Secondary | ICD-10-CM | POA: Diagnosis not present

## 2022-03-31 DIAGNOSIS — R2232 Localized swelling, mass and lump, left upper limb: Secondary | ICD-10-CM | POA: Diagnosis not present

## 2022-03-31 DIAGNOSIS — Z91038 Other insect allergy status: Secondary | ICD-10-CM | POA: Diagnosis not present

## 2022-04-10 DIAGNOSIS — C61 Malignant neoplasm of prostate: Secondary | ICD-10-CM | POA: Diagnosis not present

## 2022-04-17 DIAGNOSIS — C61 Malignant neoplasm of prostate: Secondary | ICD-10-CM | POA: Diagnosis not present

## 2022-04-17 DIAGNOSIS — M8580 Other specified disorders of bone density and structure, unspecified site: Secondary | ICD-10-CM | POA: Diagnosis not present

## 2022-06-05 DIAGNOSIS — H2512 Age-related nuclear cataract, left eye: Secondary | ICD-10-CM | POA: Diagnosis not present

## 2022-06-05 DIAGNOSIS — H269 Unspecified cataract: Secondary | ICD-10-CM | POA: Diagnosis not present

## 2022-07-17 DIAGNOSIS — C61 Malignant neoplasm of prostate: Secondary | ICD-10-CM | POA: Diagnosis not present

## 2022-07-24 DIAGNOSIS — C61 Malignant neoplasm of prostate: Secondary | ICD-10-CM | POA: Diagnosis not present

## 2022-07-24 DIAGNOSIS — M8580 Other specified disorders of bone density and structure, unspecified site: Secondary | ICD-10-CM | POA: Diagnosis not present

## 2022-10-22 ENCOUNTER — Telehealth: Payer: Self-pay

## 2022-10-22 NOTE — Patient Outreach (Signed)
  Care Coordination   10/22/2022 Name: GRAYSEN WOODYARD MRN: [id number] DOB: [birth year]   Care Coordination Outreach Attempts:  An unsuccessful telephone outreach was attempted today to offer the patient information about available care coordination services as a benefit of their health plan.   Follow Up Plan:  Additional outreach attempts will be made to offer the patient care coordination information and services.   Encounter Outcome:  No Answer   Care Coordination Interventions:  No, not indicated    Jone Baseman, RN, MSN [facility] Care Management Coordinator Direct Line [phone]

## 2022-10-23 DIAGNOSIS — C61 Malignant neoplasm of prostate: Secondary | ICD-10-CM | POA: Diagnosis not present

## 2022-10-30 DIAGNOSIS — M8580 Other specified disorders of bone density and structure, unspecified site: Secondary | ICD-10-CM | POA: Diagnosis not present

## 2022-10-30 DIAGNOSIS — C61 Malignant neoplasm of prostate: Secondary | ICD-10-CM | POA: Diagnosis not present

## 2022-11-05 DIAGNOSIS — I2510 Atherosclerotic heart disease of native coronary artery without angina pectoris: Secondary | ICD-10-CM | POA: Diagnosis not present

## 2022-11-12 DIAGNOSIS — N200 Calculus of kidney: Secondary | ICD-10-CM | POA: Diagnosis not present

## 2022-11-12 DIAGNOSIS — I10 Essential (primary) hypertension: Secondary | ICD-10-CM | POA: Diagnosis not present

## 2022-11-12 DIAGNOSIS — I2510 Atherosclerotic heart disease of native coronary artery without angina pectoris: Secondary | ICD-10-CM | POA: Diagnosis not present

## 2022-11-12 DIAGNOSIS — R600 Localized edema: Secondary | ICD-10-CM | POA: Diagnosis not present

## 2022-11-12 DIAGNOSIS — D72819 Decreased white blood cell count, unspecified: Secondary | ICD-10-CM | POA: Diagnosis not present

## 2022-11-12 DIAGNOSIS — Z0000 Encounter for general adult medical examination without abnormal findings: Secondary | ICD-10-CM | POA: Diagnosis not present

## 2022-11-12 DIAGNOSIS — Z23 Encounter for immunization: Secondary | ICD-10-CM | POA: Diagnosis not present

## 2022-12-23 ENCOUNTER — Other Ambulatory Visit (HOSPITAL_COMMUNITY): Payer: Self-pay | Admitting: Urology

## 2022-12-23 DIAGNOSIS — C61 Malignant neoplasm of prostate: Secondary | ICD-10-CM

## 2023-01-29 DIAGNOSIS — C61 Malignant neoplasm of prostate: Secondary | ICD-10-CM | POA: Diagnosis not present

## 2023-01-30 ENCOUNTER — Encounter (HOSPITAL_COMMUNITY)
Admission: RE | Admit: 2023-01-30 | Discharge: 2023-01-30 | Disposition: A | Discharge status: Home / Self Care | Payer: Medicare Other | Source: Ambulatory Visit | Attending: Urology | Admitting: Urology

## 2023-01-30 DIAGNOSIS — N281 Cyst of kidney, acquired: Secondary | ICD-10-CM | POA: Diagnosis not present

## 2023-01-30 DIAGNOSIS — C61 Malignant neoplasm of prostate: Secondary | ICD-10-CM | POA: Insufficient documentation

## 2023-01-30 DIAGNOSIS — N200 Calculus of kidney: Secondary | ICD-10-CM | POA: Diagnosis not present

## 2023-01-30 MED ORDER — TECHNETIUM TC 99M MEDRONATE IV KIT
22.0000 | PACK | Freq: Once | INTRAVENOUS | Status: AC
  Administered 2023-01-30: 22 via INTRAVENOUS

## 2023-02-05 DIAGNOSIS — M8580 Other specified disorders of bone density and structure, unspecified site: Secondary | ICD-10-CM | POA: Diagnosis not present

## 2023-02-05 DIAGNOSIS — C61 Malignant neoplasm of prostate: Secondary | ICD-10-CM | POA: Diagnosis not present

## 2023-03-23 ENCOUNTER — Encounter (INDEPENDENT_AMBULATORY_CARE_PROVIDER_SITE_OTHER): Payer: Medicare Other | Admitting: Ophthalmology

## 2023-05-07 DIAGNOSIS — C61 Malignant neoplasm of prostate: Secondary | ICD-10-CM | POA: Diagnosis not present

## 2023-05-14 DIAGNOSIS — C61 Malignant neoplasm of prostate: Secondary | ICD-10-CM | POA: Diagnosis not present

## 2023-05-14 DIAGNOSIS — M8580 Other specified disorders of bone density and structure, unspecified site: Secondary | ICD-10-CM | POA: Diagnosis not present

## 2023-06-08 DIAGNOSIS — H43812 Vitreous degeneration, left eye: Secondary | ICD-10-CM | POA: Diagnosis not present

## 2023-06-08 DIAGNOSIS — H26492 Other secondary cataract, left eye: Secondary | ICD-10-CM | POA: Diagnosis not present

## 2023-06-29 DIAGNOSIS — H43812 Vitreous degeneration, left eye: Secondary | ICD-10-CM | POA: Diagnosis not present

## 2023-08-27 DIAGNOSIS — C61 Malignant neoplasm of prostate: Secondary | ICD-10-CM | POA: Diagnosis not present

## 2023-09-03 DIAGNOSIS — M8580 Other specified disorders of bone density and structure, unspecified site: Secondary | ICD-10-CM | POA: Diagnosis not present

## 2023-09-03 DIAGNOSIS — C61 Malignant neoplasm of prostate: Secondary | ICD-10-CM | POA: Diagnosis not present

## 2023-10-01 DIAGNOSIS — H524 Presbyopia: Secondary | ICD-10-CM | POA: Diagnosis not present

## 2023-11-12 DIAGNOSIS — C61 Malignant neoplasm of prostate: Secondary | ICD-10-CM | POA: Diagnosis not present

## 2023-11-12 DIAGNOSIS — M8580 Other specified disorders of bone density and structure, unspecified site: Secondary | ICD-10-CM | POA: Diagnosis not present

## 2023-12-29 DIAGNOSIS — R7303 Prediabetes: Secondary | ICD-10-CM | POA: Diagnosis not present

## 2023-12-29 DIAGNOSIS — I10 Essential (primary) hypertension: Secondary | ICD-10-CM | POA: Diagnosis not present

## 2023-12-29 DIAGNOSIS — D72819 Decreased white blood cell count, unspecified: Secondary | ICD-10-CM | POA: Diagnosis not present

## 2023-12-29 DIAGNOSIS — Z125 Encounter for screening for malignant neoplasm of prostate: Secondary | ICD-10-CM | POA: Diagnosis not present

## 2024-01-05 DIAGNOSIS — M8589 Other specified disorders of bone density and structure, multiple sites: Secondary | ICD-10-CM | POA: Diagnosis not present

## 2024-01-05 DIAGNOSIS — I2584 Coronary atherosclerosis due to calcified coronary lesion: Secondary | ICD-10-CM | POA: Diagnosis not present

## 2024-01-05 DIAGNOSIS — Z0000 Encounter for general adult medical examination without abnormal findings: Secondary | ICD-10-CM | POA: Diagnosis not present

## 2024-01-05 DIAGNOSIS — I700 Atherosclerosis of aorta: Secondary | ICD-10-CM | POA: Diagnosis not present

## 2024-01-12 DIAGNOSIS — I2584 Coronary atherosclerosis due to calcified coronary lesion: Secondary | ICD-10-CM | POA: Diagnosis not present

## 2024-01-12 DIAGNOSIS — E1169 Type 2 diabetes mellitus with other specified complication: Secondary | ICD-10-CM | POA: Diagnosis not present

## 2024-01-12 DIAGNOSIS — I10 Essential (primary) hypertension: Secondary | ICD-10-CM | POA: Diagnosis not present

## 2024-01-13 DIAGNOSIS — R162 Hepatomegaly with splenomegaly, not elsewhere classified: Secondary | ICD-10-CM | POA: Diagnosis not present

## 2024-01-13 DIAGNOSIS — R7401 Elevation of levels of liver transaminase levels: Secondary | ICD-10-CM | POA: Diagnosis not present

## 2024-01-13 DIAGNOSIS — N200 Calculus of kidney: Secondary | ICD-10-CM | POA: Diagnosis not present

## 2024-01-21 DIAGNOSIS — R7303 Prediabetes: Secondary | ICD-10-CM | POA: Diagnosis not present

## 2024-01-21 DIAGNOSIS — I10 Essential (primary) hypertension: Secondary | ICD-10-CM | POA: Diagnosis not present

## 2024-01-26 ENCOUNTER — Other Ambulatory Visit: Payer: Self-pay | Admitting: Nurse Practitioner

## 2024-01-26 DIAGNOSIS — R7989 Other specified abnormal findings of blood chemistry: Secondary | ICD-10-CM

## 2024-01-26 DIAGNOSIS — R7401 Elevation of levels of liver transaminase levels: Secondary | ICD-10-CM | POA: Diagnosis not present

## 2024-01-26 DIAGNOSIS — R945 Abnormal results of liver function studies: Secondary | ICD-10-CM | POA: Diagnosis not present

## 2024-01-28 DIAGNOSIS — N202 Calculus of kidney with calculus of ureter: Secondary | ICD-10-CM | POA: Diagnosis not present

## 2024-02-03 ENCOUNTER — Ambulatory Visit
Admission: RE | Admit: 2024-02-03 | Discharge: 2024-02-03 | Disposition: A | Discharge status: Home / Self Care | Source: Ambulatory Visit | Attending: Nurse Practitioner | Admitting: Nurse Practitioner

## 2024-02-03 DIAGNOSIS — K760 Fatty (change of) liver, not elsewhere classified: Secondary | ICD-10-CM | POA: Diagnosis not present

## 2024-02-03 DIAGNOSIS — R7989 Other specified abnormal findings of blood chemistry: Secondary | ICD-10-CM

## 2024-02-23 DIAGNOSIS — I10 Essential (primary) hypertension: Secondary | ICD-10-CM | POA: Diagnosis not present

## 2024-02-23 DIAGNOSIS — E1169 Type 2 diabetes mellitus with other specified complication: Secondary | ICD-10-CM | POA: Diagnosis not present

## 2024-02-23 DIAGNOSIS — I2584 Coronary atherosclerosis due to calcified coronary lesion: Secondary | ICD-10-CM | POA: Diagnosis not present

## 2024-03-22 DIAGNOSIS — H02834 Dermatochalasis of left upper eyelid: Secondary | ICD-10-CM | POA: Diagnosis not present

## 2024-03-22 DIAGNOSIS — H35341 Macular cyst, hole, or pseudohole, right eye: Secondary | ICD-10-CM | POA: Diagnosis not present

## 2024-03-22 DIAGNOSIS — H43812 Vitreous degeneration, left eye: Secondary | ICD-10-CM | POA: Diagnosis not present

## 2024-03-22 DIAGNOSIS — H02831 Dermatochalasis of right upper eyelid: Secondary | ICD-10-CM | POA: Diagnosis not present

## 2024-04-14 DIAGNOSIS — I10 Essential (primary) hypertension: Secondary | ICD-10-CM | POA: Diagnosis not present

## 2024-04-14 DIAGNOSIS — R7303 Prediabetes: Secondary | ICD-10-CM | POA: Diagnosis not present

## 2024-05-02 DIAGNOSIS — K7400 Hepatic fibrosis, unspecified: Secondary | ICD-10-CM | POA: Diagnosis not present

## 2024-05-02 DIAGNOSIS — R7401 Elevation of levels of liver transaminase levels: Secondary | ICD-10-CM | POA: Diagnosis not present

## 2024-05-05 DIAGNOSIS — C61 Malignant neoplasm of prostate: Secondary | ICD-10-CM | POA: Diagnosis not present

## 2024-05-12 DIAGNOSIS — N200 Calculus of kidney: Secondary | ICD-10-CM | POA: Diagnosis not present

## 2024-05-12 DIAGNOSIS — C61 Malignant neoplasm of prostate: Secondary | ICD-10-CM | POA: Diagnosis not present

## 2024-05-12 DIAGNOSIS — M8580 Other specified disorders of bone density and structure, unspecified site: Secondary | ICD-10-CM | POA: Diagnosis not present

## 2024-05-26 DIAGNOSIS — I2584 Coronary atherosclerosis due to calcified coronary lesion: Secondary | ICD-10-CM | POA: Diagnosis not present

## 2024-05-26 DIAGNOSIS — E1169 Type 2 diabetes mellitus with other specified complication: Secondary | ICD-10-CM | POA: Diagnosis not present

## 2024-06-02 DIAGNOSIS — I2584 Coronary atherosclerosis due to calcified coronary lesion: Secondary | ICD-10-CM | POA: Diagnosis not present

## 2024-06-02 DIAGNOSIS — E1169 Type 2 diabetes mellitus with other specified complication: Secondary | ICD-10-CM | POA: Diagnosis not present

## 2024-06-02 DIAGNOSIS — I10 Essential (primary) hypertension: Secondary | ICD-10-CM | POA: Diagnosis not present

## 2024-06-03 DIAGNOSIS — R899 Unspecified abnormal finding in specimens from other organs, systems and tissues: Secondary | ICD-10-CM | POA: Diagnosis not present

## 2024-06-16 DIAGNOSIS — R7303 Prediabetes: Secondary | ICD-10-CM | POA: Diagnosis not present

## 2024-07-19 DIAGNOSIS — N1831 Chronic kidney disease, stage 3a: Secondary | ICD-10-CM | POA: Diagnosis not present

## 2024-07-19 DIAGNOSIS — I129 Hypertensive chronic kidney disease with stage 1 through stage 4 chronic kidney disease, or unspecified chronic kidney disease: Secondary | ICD-10-CM | POA: Diagnosis not present

## 2024-07-19 DIAGNOSIS — N200 Calculus of kidney: Secondary | ICD-10-CM | POA: Diagnosis not present
